# Patient Record
Sex: Female | Born: 1960
Health system: Southern US, Community
[De-identification: ages and names within clinical notes are randomized; demographics above are authoritative.]

## PROBLEM LIST (undated history)

## (undated) DIAGNOSIS — Z98811 Dental restoration status: Secondary | ICD-10-CM

## (undated) DIAGNOSIS — R011 Cardiac murmur, unspecified: Secondary | ICD-10-CM

## (undated) DIAGNOSIS — E039 Hypothyroidism, unspecified: Secondary | ICD-10-CM

## (undated) DIAGNOSIS — Z8619 Personal history of other infectious and parasitic diseases: Secondary | ICD-10-CM

## (undated) DIAGNOSIS — M1990 Unspecified osteoarthritis, unspecified site: Secondary | ICD-10-CM

## (undated) DIAGNOSIS — M5020 Other cervical disc displacement, unspecified cervical region: Secondary | ICD-10-CM

## (undated) DIAGNOSIS — T783XXA Angioneurotic edema, initial encounter: Secondary | ICD-10-CM

## (undated) DIAGNOSIS — M2241 Chondromalacia patellae, right knee: Secondary | ICD-10-CM

## (undated) DIAGNOSIS — E785 Hyperlipidemia, unspecified: Secondary | ICD-10-CM

## (undated) DIAGNOSIS — R000 Tachycardia, unspecified: Secondary | ICD-10-CM

## (undated) DIAGNOSIS — K219 Gastro-esophageal reflux disease without esophagitis: Secondary | ICD-10-CM

## (undated) DIAGNOSIS — T7840XA Allergy, unspecified, initial encounter: Secondary | ICD-10-CM

## (undated) DIAGNOSIS — M6751 Plica syndrome, right knee: Secondary | ICD-10-CM

## (undated) DIAGNOSIS — Z8489 Family history of other specified conditions: Secondary | ICD-10-CM

## (undated) DIAGNOSIS — B029 Zoster without complications: Secondary | ICD-10-CM

## (undated) HISTORY — DX: Gastro-esophageal reflux disease without esophagitis: K21.9

## (undated) HISTORY — PX: SHOULDER ARTHROSCOPY: SHX128

## (undated) HISTORY — DX: Hypothyroidism, unspecified: E03.9

## (undated) HISTORY — DX: Hyperlipidemia, unspecified: E78.5

## (undated) HISTORY — DX: Allergy, unspecified, initial encounter: T78.40XA

## (undated) HISTORY — DX: Unspecified osteoarthritis, unspecified site: M19.90

## (undated) HISTORY — PX: SPINE SURGERY: SHX786

## (undated) HISTORY — DX: Angioneurotic edema, initial encounter: T78.3XXA

## (undated) HISTORY — DX: Other cervical disc displacement, unspecified cervical region: M50.20

---
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
## 2001-05-07 ENCOUNTER — Other Ambulatory Visit: Admission: RE | Admit: 2001-05-07 | Discharge: 2001-05-07 | Payer: Self-pay | Admitting: *Deleted

## 2003-04-28 ENCOUNTER — Encounter: Payer: Self-pay | Admitting: *Deleted

## 2003-04-28 ENCOUNTER — Ambulatory Visit (HOSPITAL_COMMUNITY): Admission: RE | Admit: 2003-04-28 | Discharge: 2003-04-28 | Payer: Self-pay | Admitting: *Deleted

## 2007-04-15 ENCOUNTER — Ambulatory Visit (HOSPITAL_COMMUNITY): Admission: RE | Admit: 2007-04-15 | Discharge: 2007-04-15 | Payer: Self-pay | Admitting: Pulmonary Disease

## 2007-04-18 ENCOUNTER — Encounter (INDEPENDENT_AMBULATORY_CARE_PROVIDER_SITE_OTHER): Payer: Self-pay | Admitting: General Surgery

## 2007-04-18 ENCOUNTER — Ambulatory Visit (HOSPITAL_COMMUNITY): Admission: RE | Admit: 2007-04-18 | Discharge: 2007-04-18 | Payer: Self-pay | Admitting: General Surgery

## 2007-04-18 HISTORY — PX: CHOLECYSTECTOMY: SHX55

## 2007-11-12 ENCOUNTER — Other Ambulatory Visit: Admission: RE | Admit: 2007-11-12 | Discharge: 2007-11-12 | Payer: Self-pay | Admitting: Obstetrics & Gynecology

## 2007-11-14 ENCOUNTER — Ambulatory Visit (HOSPITAL_COMMUNITY): Admission: RE | Admit: 2007-11-14 | Discharge: 2007-11-14 | Payer: Self-pay | Admitting: Obstetrics & Gynecology

## 2008-03-03 ENCOUNTER — Ambulatory Visit (HOSPITAL_COMMUNITY): Admission: RE | Admit: 2008-03-03 | Discharge: 2008-03-03 | Payer: Self-pay | Admitting: Pulmonary Disease

## 2008-06-20 ENCOUNTER — Encounter: Payer: Self-pay | Admitting: Orthopedic Surgery

## 2008-06-23 ENCOUNTER — Ambulatory Visit: Payer: Self-pay | Admitting: Orthopedic Surgery

## 2008-06-23 DIAGNOSIS — M7580 Other shoulder lesions, unspecified shoulder: Secondary | ICD-10-CM

## 2008-06-23 DIAGNOSIS — M25519 Pain in unspecified shoulder: Secondary | ICD-10-CM

## 2008-11-22 ENCOUNTER — Ambulatory Visit: Payer: Self-pay | Admitting: Orthopedic Surgery

## 2008-11-29 ENCOUNTER — Encounter (HOSPITAL_COMMUNITY): Admission: RE | Admit: 2008-11-29 | Discharge: 2008-12-29 | Payer: Self-pay | Admitting: Orthopedic Surgery

## 2008-12-06 ENCOUNTER — Encounter: Payer: Self-pay | Admitting: Orthopedic Surgery

## 2008-12-14 ENCOUNTER — Encounter: Payer: Self-pay | Admitting: Orthopedic Surgery

## 2008-12-27 ENCOUNTER — Encounter: Payer: Self-pay | Admitting: Orthopedic Surgery

## 2009-01-04 ENCOUNTER — Encounter (HOSPITAL_COMMUNITY): Admission: RE | Admit: 2009-01-04 | Discharge: 2009-02-03 | Payer: Self-pay | Admitting: Orthopedic Surgery

## 2009-01-20 ENCOUNTER — Ambulatory Visit: Payer: Self-pay | Admitting: Orthopedic Surgery

## 2009-02-01 ENCOUNTER — Telehealth: Payer: Self-pay | Admitting: Orthopedic Surgery

## 2009-02-07 ENCOUNTER — Emergency Department (HOSPITAL_COMMUNITY): Admission: EM | Admit: 2009-02-07 | Discharge: 2009-02-07 | Payer: Self-pay | Admitting: Emergency Medicine

## 2009-02-23 ENCOUNTER — Encounter: Payer: Self-pay | Admitting: Orthopedic Surgery

## 2009-02-27 ENCOUNTER — Emergency Department (HOSPITAL_COMMUNITY): Admission: EM | Admit: 2009-02-27 | Discharge: 2009-02-27 | Payer: Self-pay | Admitting: Emergency Medicine

## 2009-03-09 ENCOUNTER — Ambulatory Visit: Payer: Self-pay | Admitting: Orthopedic Surgery

## 2009-03-09 DIAGNOSIS — M75120 Complete rotator cuff tear or rupture of unspecified shoulder, not specified as traumatic: Secondary | ICD-10-CM

## 2009-03-10 ENCOUNTER — Encounter (INDEPENDENT_AMBULATORY_CARE_PROVIDER_SITE_OTHER): Payer: Self-pay | Admitting: *Deleted

## 2009-03-15 ENCOUNTER — Encounter: Payer: Self-pay | Admitting: Orthopedic Surgery

## 2009-03-23 ENCOUNTER — Ambulatory Visit: Payer: Self-pay | Admitting: Orthopedic Surgery

## 2009-04-25 ENCOUNTER — Ambulatory Visit: Payer: Self-pay | Admitting: Orthopedic Surgery

## 2009-04-25 ENCOUNTER — Telehealth: Payer: Self-pay | Admitting: Orthopedic Surgery

## 2009-04-26 ENCOUNTER — Encounter: Payer: Self-pay | Admitting: Orthopedic Surgery

## 2009-05-25 ENCOUNTER — Telehealth: Payer: Self-pay | Admitting: Orthopedic Surgery

## 2009-05-31 ENCOUNTER — Encounter (HOSPITAL_COMMUNITY): Admission: RE | Admit: 2009-05-31 | Discharge: 2009-06-29 | Payer: Self-pay | Admitting: Surgery

## 2009-07-01 ENCOUNTER — Encounter (HOSPITAL_COMMUNITY): Admission: RE | Admit: 2009-07-01 | Discharge: 2009-07-31 | Payer: Self-pay | Admitting: Surgery

## 2009-08-01 ENCOUNTER — Encounter (HOSPITAL_COMMUNITY): Admission: RE | Admit: 2009-08-01 | Discharge: 2009-08-31 | Payer: Self-pay | Admitting: Surgery

## 2010-06-09 ENCOUNTER — Ambulatory Visit (HOSPITAL_COMMUNITY): Admission: RE | Admit: 2010-06-09 | Discharge: 2010-06-09 | Payer: Self-pay | Admitting: Pulmonary Disease

## 2010-09-04 ENCOUNTER — Ambulatory Visit: Payer: Self-pay | Admitting: Internal Medicine

## 2011-01-09 LAB — POCT I-STAT, CHEM 8
Creatinine, Ser: 0.9 mg/dL (ref 0.4–1.2)
Glucose, Bld: 115 mg/dL — ABNORMAL HIGH (ref 70–99)
Hemoglobin: 14.6 g/dL (ref 12.0–15.0)
Potassium: 3.6 mEq/L (ref 3.5–5.1)

## 2011-01-15 ENCOUNTER — Emergency Department (HOSPITAL_COMMUNITY)
Admission: EM | Admit: 2011-01-15 | Discharge: 2011-01-16 | Disposition: A | Discharge status: Home / Self Care | Payer: BLUE CROSS/BLUE SHIELD | Attending: Emergency Medicine | Admitting: Emergency Medicine

## 2011-01-15 DIAGNOSIS — E039 Hypothyroidism, unspecified: Secondary | ICD-10-CM | POA: Insufficient documentation

## 2011-01-15 DIAGNOSIS — Z79899 Other long term (current) drug therapy: Secondary | ICD-10-CM | POA: Insufficient documentation

## 2011-01-15 DIAGNOSIS — N898 Other specified noninflammatory disorders of vagina: Secondary | ICD-10-CM | POA: Insufficient documentation

## 2011-01-15 DIAGNOSIS — R109 Unspecified abdominal pain: Secondary | ICD-10-CM | POA: Insufficient documentation

## 2011-01-15 DIAGNOSIS — K219 Gastro-esophageal reflux disease without esophagitis: Secondary | ICD-10-CM | POA: Insufficient documentation

## 2011-01-15 LAB — DIFFERENTIAL
Basophils Absolute: 0 10*3/uL (ref 0.0–0.1)
Eosinophils Relative: 3 % (ref 0–5)
Lymphocytes Relative: 29 % (ref 12–46)
Lymphs Abs: 1.9 10*3/uL (ref 0.7–4.0)
Monocytes Absolute: 0.6 10*3/uL (ref 0.1–1.0)
Monocytes Relative: 10 % (ref 3–12)

## 2011-01-15 LAB — URINALYSIS, ROUTINE W REFLEX MICROSCOPIC
Bilirubin Urine: NEGATIVE
Hgb urine dipstick: NEGATIVE
Protein, ur: NEGATIVE mg/dL
Urobilinogen, UA: 0.2 mg/dL (ref 0.0–1.0)

## 2011-01-15 LAB — CBC
HCT: 36 % (ref 36.0–46.0)
MCH: 30.8 pg (ref 26.0–34.0)
MCHC: 34.2 g/dL (ref 30.0–36.0)
MCV: 90.2 fL (ref 78.0–100.0)
RDW: 13 % (ref 11.5–15.5)

## 2011-01-16 ENCOUNTER — Ambulatory Visit (HOSPITAL_COMMUNITY): Admit: 2011-01-16 | Payer: BLUE CROSS/BLUE SHIELD

## 2011-01-16 ENCOUNTER — Ambulatory Visit (HOSPITAL_COMMUNITY)
Admission: RE | Admit: 2011-01-16 | Discharge: 2011-01-16 | Disposition: A | Discharge status: Home / Self Care | Payer: BLUE CROSS/BLUE SHIELD | Source: Ambulatory Visit | Attending: Emergency Medicine | Admitting: Emergency Medicine

## 2011-01-16 ENCOUNTER — Other Ambulatory Visit (HOSPITAL_COMMUNITY): Payer: Self-pay | Admitting: Emergency Medicine

## 2011-01-16 DIAGNOSIS — R9389 Abnormal findings on diagnostic imaging of other specified body structures: Secondary | ICD-10-CM | POA: Insufficient documentation

## 2011-01-16 DIAGNOSIS — N949 Unspecified condition associated with female genital organs and menstrual cycle: Secondary | ICD-10-CM | POA: Insufficient documentation

## 2011-01-16 DIAGNOSIS — N83209 Unspecified ovarian cyst, unspecified side: Secondary | ICD-10-CM | POA: Insufficient documentation

## 2011-01-16 DIAGNOSIS — N939 Abnormal uterine and vaginal bleeding, unspecified: Secondary | ICD-10-CM

## 2011-01-16 DIAGNOSIS — N920 Excessive and frequent menstruation with regular cycle: Secondary | ICD-10-CM | POA: Insufficient documentation

## 2011-01-22 ENCOUNTER — Other Ambulatory Visit (HOSPITAL_COMMUNITY)
Admission: RE | Admit: 2011-01-22 | Discharge: 2011-01-22 | Disposition: A | Discharge status: Home / Self Care | Payer: BLUE CROSS/BLUE SHIELD | Source: Ambulatory Visit | Attending: Obstetrics & Gynecology | Admitting: Obstetrics & Gynecology

## 2011-01-22 ENCOUNTER — Other Ambulatory Visit: Payer: Self-pay | Admitting: Obstetrics & Gynecology

## 2011-01-22 DIAGNOSIS — Z01419 Encounter for gynecological examination (general) (routine) without abnormal findings: Secondary | ICD-10-CM | POA: Insufficient documentation

## 2011-02-13 NOTE — H&P (Signed)
NAMELATRISA, Ashley [name]                ACCOUNT NO.:  [id number]   MEDICAL RECORD NO.:  [id number]          PATIENT TYPE:  AMB   LOCATION:  DAY                           FACILITY:  [facility]   PHYSICIAN:  Dalia Heading, M.D.  DATE OF BIRTH:  [birth year]   DATE OF ADMISSION:  DATE OF DISCHARGE:  LH                              HISTORY & PHYSICAL   CHIEF COMPLAINT:  Cholecystitis, cholelithiasis.   HISTORY OF PRESENT ILLNESS:  The patient is a 50 year old white female  who is referred for evaluation and treatment of biliary colic secondary  to cholelithiasis.  She has been having right upper quadrant abdominal  discomfort, nausea and bloating for many months.  She does have fatty  food intolerance.  No fever, chills or jaundice have been noted.   PAST MEDICAL HISTORY:  1. Hypothyroidism.  2. Possible SVT.   PAST SURGICAL HISTORY:  Appendectomy.   CURRENT MEDICATIONS:  1. Atenolol 50 mg p.o. daily.  2. Synthroid 100 mcg p.o. daily.  3. Claritin p.r.n.   ALLERGIES:  SULFA.   REVIEW OF SYSTEMS:  The patient denies drinking or smoking.  She denies  any recent episodes of SVT.   PHYSICAL EXAMINATION:  GENERAL:  The patient is a well-developed, well-  nourished white female in no acute distress.  HEENT:  No scleral icterus.  LUNGS:  Clear to auscultation with good breath sounds bilaterally.  HEART:  Regular rate and rhythm without S3, S4 or murmurs.  ABDOMEN:  Soft, nontender, nondistended.  No hepatosplenomegaly, masses  or hernias are identified.   STUDIES:  Ultrasound of the gallbladder reveals cholelithiasis with  normal common bile duct.   IMPRESSION:  Cholecystitis, cholelithiasis.   PLAN:  The patient is scheduled for a laparoscopic cholecystectomy on  April 18, 2007.  The risks and benefits of the procedure including  bleeding, infection, hepatobiliary injury and the possibility of an open  procedure were fully explained to the patient, gave informed consent.      Dalia Heading, M.D.  Electronically Signed     MAJ/MEDQ  D:  04/17/2007  T:  04/17/2007  Job:  [id number]   cc:   Short Stay at [facility] L. Juanetta Gosling, M.D.  Fax: [phone]

## 2011-02-13 NOTE — Op Note (Signed)
NAMEJENYA, Ashley [name]                ACCOUNT NO.:  [id number]   MEDICAL RECORD NO.:  [id number]          PATIENT TYPE:  AMB   LOCATION:  DAY                           FACILITY:  [facility]   PHYSICIAN:  Dalia Heading, M.D.  DATE OF BIRTH:  14-Nov-1960   DATE OF PROCEDURE:  04/18/2007  DATE OF DISCHARGE:                               OPERATIVE REPORT   PREOPERATIVE DIAGNOSIS:  Cholecystitis and cholelithiasis.   POSTOPERATIVE DIAGNOSIS:  Cholecystitis and cholelithiasis.   PROCEDURE:  Laparoscopic cholecystectomy.   SURGEON:  Dalia Heading, M.D.   ANESTHESIA:  General endotracheal.   INDICATIONS:  The patient is a 50 year old white female who presents  with cholecystitis secondary to cholelithiasis.  The risks and benefits  of the procedure including bleeding, infection, hepatobiliary injury,  and the possibility of an open procedure were fully explained to the  patient, who gave informed consent.   PROCEDURE NOTE:  The patient was placed in the supine position.  After  induction of general endotracheal anesthesia, the abdomen was prepped  and draped using the usual sterile technique with Betadine.  Surgical  site confirmation was performed.   An infraumbilical incision was made down to the fascia.  The Veress  needle was introduced into the abdominal cavity and confirmation of  placement was done using the saline drop test.  The abdomen was then  insufflated to 16 mmHg pressure.  An 11 mm trocar was introduced into  the abdominal cavity under direct visualization without difficulty.  The  patient was placed in reversed Trendelenburg position and an additional  11 mm trocar was placed in the epigastric region and 5 mm trocars were  placed in the right upper quadrant and right flank regions.  The liver  was inspected and noted to be within normal limits.  The gallbladder was  retracted superiorly and laterally.  The dissection was begun around the  infundibulum of the gallbladder.   The cystic duct was first identified.  Its juncture to the infundibulum was fully identified.  An endoclip was  placed proximally and distally on the cystic duct and the cystic duct  was divided.  This, likewise, was done on the cystic artery.  The  gallbladder was then freed away from the gallbladder fossa using Bovie  electrocautery.  The gallbladder was delivered through the epigastric  trocar site using an EndoCatch bag.  The gallbladder fossa was  inspected.  No abnormal bleeding or bile leakage was noted.  Surgicel  was placed in the gallbladder fossa.  All fluid and air were then  evacuated from the abdominal cavity prior to removal of the trocars.   All wounds were irrigated with normal saline.  All wounds were injected  with 0.5% Sensorcaine.  The infraumbilical fascia as well as epigastric  fascia were reapproximated using 0 Vicryl interrupted sutures.  All skin  incisions were closed using a 4-0 Vicryl subcuticular suture.  0.5%  Sensorcaine was instilled in the surrounding  wound.  Dermabond was then applied.  All tape and needle counts were  correct at the end of the procedure.  The patient was extubated in the  operating room and went back to the recovery room awake in stable  condition.  Complications none. Specimens gallbladder.  Blood loss  minimal.      Dalia Heading, M.D.  Electronically Signed     MAJ/MEDQ  D:  04/18/2007  T:  04/18/2007  Job:  [id number]   cc:   Ramon Dredge L. Juanetta Gosling, M.D.  Fax: [phone]

## 2011-03-09 ENCOUNTER — Encounter (HOSPITAL_COMMUNITY): Payer: BC Managed Care – PPO

## 2011-03-09 ENCOUNTER — Other Ambulatory Visit: Payer: Self-pay | Admitting: Obstetrics & Gynecology

## 2011-03-09 ENCOUNTER — Other Ambulatory Visit: Payer: Self-pay | Admitting: Infectious Diseases

## 2011-03-09 LAB — URINALYSIS, ROUTINE W REFLEX MICROSCOPIC
Glucose, UA: NEGATIVE mg/dL
Ketones, ur: NEGATIVE mg/dL
Leukocytes, UA: NEGATIVE
Nitrite: NEGATIVE
Protein, ur: NEGATIVE mg/dL

## 2011-03-09 LAB — COMPREHENSIVE METABOLIC PANEL
Alkaline Phosphatase: 64 U/L (ref 39–117)
BUN: 15 mg/dL (ref 6–23)
Chloride: 102 mEq/L (ref 96–112)
GFR calc Af Amer: >60 mL/min (ref >60)
Glucose, Bld: 119 mg/dL — ABNORMAL HIGH (ref 70–99)
Potassium: 4.3 mEq/L (ref 3.5–5.1)
Total Bilirubin: 0.3 mg/dL (ref 0.3–1.2)

## 2011-03-09 LAB — CBC
HCT: 36.4 % (ref 36.0–46.0)
Hemoglobin: 11.8 g/dL — ABNORMAL LOW (ref 12.0–15.0)
WBC: 4.4 10*3/uL (ref 4.0–10.5)

## 2011-03-14 ENCOUNTER — Other Ambulatory Visit: Payer: Self-pay | Admitting: Obstetrics & Gynecology

## 2011-03-14 ENCOUNTER — Ambulatory Visit (HOSPITAL_COMMUNITY)
Admission: RE | Admit: 2011-03-14 | Discharge: 2011-03-14 | Disposition: A | Discharge status: Home / Self Care | Payer: BC Managed Care – PPO | Source: Ambulatory Visit | Attending: Obstetrics & Gynecology | Admitting: Obstetrics & Gynecology

## 2011-03-14 DIAGNOSIS — N946 Dysmenorrhea, unspecified: Secondary | ICD-10-CM | POA: Insufficient documentation

## 2011-03-14 DIAGNOSIS — Z01812 Encounter for preprocedural laboratory examination: Secondary | ICD-10-CM | POA: Insufficient documentation

## 2011-03-14 DIAGNOSIS — N920 Excessive and frequent menstruation with regular cycle: Secondary | ICD-10-CM | POA: Insufficient documentation

## 2011-03-14 DIAGNOSIS — Z01810 Encounter for preprocedural cardiovascular examination: Secondary | ICD-10-CM | POA: Insufficient documentation

## 2011-03-14 HISTORY — PX: HYSTEROSCOPY W/D&C: SHX1775

## 2011-03-14 HISTORY — PX: ENDOMETRIAL ABLATION: SHX621

## 2011-03-15 NOTE — Op Note (Signed)
  NAMEPUANANI, GENE                ACCOUNT NO.:  [id number]  MEDICAL RECORD NO.:  [id number]  LOCATION:  DAYP                          FACILITY:  [facility]  PHYSICIAN:  Lazaro Arms, M.D.   DATE OF BIRTH:  07/25/1961  DATE OF PROCEDURE:  03/14/2011 DATE OF DISCHARGE:                              OPERATIVE REPORT   PREOPERATIVE DIAGNOSES: 1. Menometrorrhagia. 2. Dysmenorrhea.  POSTOPERATIVE DIAGNOSES: 1. Menometrorrhagia. 2. Dysmenorrhea.  PROCEDURE:  Hysteroscopy, D and C, endometrial ablation.  SURGEON:  Lazaro Arms, MD  ANESTHESIA:  General endotracheal.  FINDINGS:  The patient normal endometrium and polyps.  No fibroids.  No abnormalities.  DESCRIPTION OF OPERATION:  The patient was taken to the operating room, placed in the supine position where she underwent general endotracheal anesthesia.  She was then placed in lithotomy position, prepped and draped in usual sterile fashion.  Graves speculum was placed.  Cervix was grasped and the cervix was dilated serially to allow passage of the hysteroscope.  A diagnostic hysteroscopy was performed found to be normal.  ThermaChoice III endometrial ablation balloon was used, 36 mL of D5W was required to maintain a pressure between 190 and 200 mmHg throughout the procedure.  The fluid was heated up to 87 degrees Celsius.  Total therapy time was 11 minutes and 22 seconds.  The patient tolerated the procedure well.  She experienced minimal blood loss, taken to recovery room in stable condition.  All counts were correct.  All the equipment worked properly.  All the fluid was returned at the end of the procedure.  She received Ancef and Toradol prophylactically.     Lazaro Arms, M.D.    Loraine Maple  D:  03/14/2011  T:  03/15/2011  Job:  [id number]  Electronically Signed by Duane Lope M.D. on 03/15/2011 05:05:38 PM

## 2011-07-16 LAB — HEPATIC FUNCTION PANEL
ALT: 28
Alkaline Phosphatase: 43
Bilirubin, Direct: 0.1
Indirect Bilirubin: 0.6
Total Bilirubin: 0.7

## 2011-07-16 LAB — BASIC METABOLIC PANEL
CO2: 26
Chloride: 104
Creatinine, Ser: 0.83
GFR calc Af Amer: >60
Potassium: 4.7

## 2011-07-16 LAB — CBC
HCT: 40.2
MCHC: 33.9
MCV: 93.9
RBC: 4.28

## 2011-07-16 LAB — HCG, QUANTITATIVE, PREGNANCY: hCG, Beta Chain, Quant, S: <2

## 2011-07-24 ENCOUNTER — Other Ambulatory Visit (HOSPITAL_COMMUNITY): Payer: Self-pay | Admitting: Pulmonary Disease

## 2011-07-24 DIAGNOSIS — R52 Pain, unspecified: Secondary | ICD-10-CM

## 2011-07-25 ENCOUNTER — Other Ambulatory Visit (HOSPITAL_COMMUNITY): Payer: Self-pay | Admitting: Pulmonary Disease

## 2011-07-25 ENCOUNTER — Ambulatory Visit (HOSPITAL_COMMUNITY)
Admission: RE | Admit: 2011-07-25 | Discharge: 2011-07-25 | Disposition: A | Discharge status: Home / Self Care | Payer: BC Managed Care – PPO | Source: Ambulatory Visit | Attending: Pulmonary Disease | Admitting: Pulmonary Disease

## 2011-07-25 DIAGNOSIS — R52 Pain, unspecified: Secondary | ICD-10-CM

## 2011-07-25 DIAGNOSIS — N83209 Unspecified ovarian cyst, unspecified side: Secondary | ICD-10-CM | POA: Insufficient documentation

## 2011-07-25 DIAGNOSIS — R109 Unspecified abdominal pain: Secondary | ICD-10-CM | POA: Insufficient documentation

## 2011-07-25 DIAGNOSIS — N949 Unspecified condition associated with female genital organs and menstrual cycle: Secondary | ICD-10-CM | POA: Insufficient documentation

## 2011-08-08 ENCOUNTER — Other Ambulatory Visit (HOSPITAL_COMMUNITY): Payer: Self-pay | Admitting: Pulmonary Disease

## 2011-08-08 ENCOUNTER — Ambulatory Visit (HOSPITAL_COMMUNITY)
Admission: RE | Admit: 2011-08-08 | Discharge: 2011-08-08 | Disposition: A | Discharge status: Home / Self Care | Payer: BC Managed Care – PPO | Source: Ambulatory Visit | Attending: Pulmonary Disease | Admitting: Pulmonary Disease

## 2011-08-08 DIAGNOSIS — M79675 Pain in left toe(s): Secondary | ICD-10-CM

## 2011-08-08 DIAGNOSIS — S8990XA Unspecified injury of unspecified lower leg, initial encounter: Secondary | ICD-10-CM | POA: Insufficient documentation

## 2011-08-08 DIAGNOSIS — M79609 Pain in unspecified limb: Secondary | ICD-10-CM | POA: Insufficient documentation

## 2011-08-08 DIAGNOSIS — S99919A Unspecified injury of unspecified ankle, initial encounter: Secondary | ICD-10-CM | POA: Insufficient documentation

## 2011-08-08 DIAGNOSIS — X58XXXA Exposure to other specified factors, initial encounter: Secondary | ICD-10-CM | POA: Insufficient documentation

## 2011-08-23 ENCOUNTER — Emergency Department (HOSPITAL_COMMUNITY)
Admission: EM | Admit: 2011-08-23 | Discharge: 2011-08-23 | Disposition: A | Discharge status: Home / Self Care | Payer: BC Managed Care – PPO | Attending: Emergency Medicine | Admitting: Emergency Medicine

## 2011-08-23 ENCOUNTER — Encounter: Payer: Self-pay | Admitting: *Deleted

## 2011-08-23 DIAGNOSIS — J209 Acute bronchitis, unspecified: Secondary | ICD-10-CM | POA: Insufficient documentation

## 2011-08-23 HISTORY — DX: Tachycardia, unspecified: R00.0

## 2011-08-23 MED ORDER — AZITHROMYCIN 250 MG PO TABS: 250.0000 mg | ORAL_TABLET | Freq: Every day | ORAL | Status: AC

## 2011-08-23 MED ORDER — HYDROCODONE-ACETAMINOPHEN 5-325 MG PO TABS: 2.0000 | ORAL_TABLET | ORAL | Status: AC | PRN

## 2011-08-23 NOTE — ED Provider Notes (Signed)
Scribed for Felisa Bonier, MD, the patient was seen in room [facility]12/[facility]12. This chart was scribed by [employer]. The patient's care started at 07:34  CSN: [id number] Arrival date & time: 08/23/2011  7:29 AM   First MD Initiated Contact with Patient 08/23/11 [phone]      Chief Complaint  Patient presents with  . Cough  . Fever  . Generalized Body Aches   HPI Ashley [name] is a 51 y.o. female who presents to the Emergency Department complaining of Cough for 1 week with associated 101 fever and generalized body aches. She states that cough is productive with a light yellowish sputum. She denies a history of asthma or tobacco use.  Past Medical History  Diagnosis Date  . Tachycardia   . Thyroid disease   . Acid reflux   . Seasonal allergies     Past Surgical History  Procedure Date  . Appendectomy   . Cholecystectomy   . Shoulder surgery right  . Uterine ablation     History reviewed. No pertinent family history.  History  Substance Use Topics  . Smoking status: Never Smoker   . Smokeless tobacco: Not on file  . Alcohol Use: No    OB History    Grav Para Term Preterm Abortions TAB SAB Ect Mult Living                  Review of Systems  Constitutional: Negative for fever.       10 Systems reviewed and are negative for acute change except as noted in the HPI.  HENT: Negative for rhinorrhea.   Eyes: Negative for discharge and redness.  Respiratory: Negative for cough and shortness of breath.   Cardiovascular: Negative for chest pain.  Gastrointestinal: Negative for vomiting, abdominal pain and diarrhea.  Genitourinary: Negative for dysuria.  Musculoskeletal: Negative for back pain.  Skin: Negative for rash.  Neurological: Negative for dizziness, syncope, speech difficulty, weakness, numbness and headaches.  Psychiatric/Behavioral: Negative for suicidal ideas, hallucinations and confusion.    Allergies  Sulfonamide derivatives  Home Medications  No  current outpatient prescriptions on file.  BP 119/73  Pulse 87  Temp(Src) 98.2 F (36.8 C) (Oral)  Resp 18  Ht 5' 11.5" (1.816 m)  Wt 190 lb (86.183 kg)  BMI 26.13 kg/m2  SpO2 100%  Physical Exam  Constitutional: She is oriented to person, place, and time. She appears well-developed and well-nourished.  Non-toxic appearance. She does not have a sickly appearance.  HENT:  Head: Normocephalic and atraumatic.  Right Ear: Tympanic membrane normal.  Left Ear: Tympanic membrane normal. Tympanic membrane is not erythematous.  Mouth/Throat: Oropharynx is clear and moist. No posterior oropharyngeal edema or posterior oropharyngeal erythema.       TM's not congested bilaterally.  Eyes: Conjunctivae, EOM and lids are normal. Pupils are equal, round, and reactive to light. No scleral icterus.  Neck: Trachea normal and normal range of motion. Neck supple.  Cardiovascular: Regular rhythm and normal heart sounds.   Pulmonary/Chest: Effort normal and breath sounds normal. No respiratory distress. She has no wheezes. She has no rhonchi. She has no rales.  Abdominal: Soft. Normal appearance and bowel sounds are normal. There is no tenderness. There is no rebound, no guarding and no CVA tenderness.  Musculoskeletal: Normal range of motion.  Neurological: She is alert and oriented to person, place, and time. She has normal strength.  Skin: Skin is warm, dry and intact. No rash noted.    ED Course  Procedures  DIAGNOSTIC STUDIES: Oxygen Saturation is 100% on room air, normal by my interpretation.    COORDINATION OF CARE: 07:45 - EDP examined patient at bedside. Patient to be discharged with antibiotics and Hydrocodone prescription for cough suppression. She is aware of plan and is in agreement.     MDM: Acute bronchitis, viral upper respiratory tract infection or both entertained in the differential diagnosis. The physical examination does not suggest pneumonia and no chest x-ray is indicated by  the physical examination.   Scribe Attestation I personally performed the services described in this documentation, which was scribed in my presence. The recorded information has been reviewed and considered.   Felisa Bonier, MD 08/23/11 [phone]

## 2011-08-23 NOTE — ED Notes (Signed)
EDP at bedside, see edp's assessment.

## 2011-08-23 NOTE — ED Notes (Signed)
Pt c/o worsening cough with fever and body aches x 1 week

## 2012-01-15 ENCOUNTER — Other Ambulatory Visit: Payer: Self-pay | Admitting: Sports Medicine

## 2012-01-15 DIAGNOSIS — M545 Low back pain: Secondary | ICD-10-CM

## 2012-01-18 ENCOUNTER — Other Ambulatory Visit: Payer: BC Managed Care – PPO

## 2012-01-23 ENCOUNTER — Ambulatory Visit
Admission: RE | Admit: 2012-01-23 | Discharge: 2012-01-23 | Disposition: A | Discharge status: Home / Self Care | Payer: BC Managed Care – PPO | Source: Ambulatory Visit | Attending: Sports Medicine | Admitting: Sports Medicine

## 2012-01-23 DIAGNOSIS — M545 Low back pain: Secondary | ICD-10-CM

## 2012-12-17 ENCOUNTER — Telehealth: Payer: Self-pay | Admitting: Adult Health

## 2012-12-17 NOTE — Telephone Encounter (Signed)
Pt needs to schedule an appt with Dr. Despina Hidden per Cyril Mourning. Pt to call tomorrow and schedule an appt.

## 2012-12-17 NOTE — Telephone Encounter (Signed)
Pt. still having vaginal burning. On new Estrogen dose...half oz every other day. Has had to take 3 Advil for the burning. What do you advise?

## 2012-12-25 ENCOUNTER — Ambulatory Visit: Payer: Self-pay | Admitting: Obstetrics & Gynecology

## 2013-12-17 ENCOUNTER — Ambulatory Visit: Payer: BC Managed Care – PPO | Admitting: Orthopedic Surgery

## 2014-02-03 ENCOUNTER — Other Ambulatory Visit: Payer: Self-pay | Admitting: Adult Health

## 2014-02-15 ENCOUNTER — Ambulatory Visit (INDEPENDENT_AMBULATORY_CARE_PROVIDER_SITE_OTHER): Payer: BC Managed Care – PPO | Admitting: Adult Health

## 2014-02-15 ENCOUNTER — Encounter: Payer: Self-pay | Admitting: Adult Health

## 2014-02-15 ENCOUNTER — Other Ambulatory Visit (HOSPITAL_COMMUNITY)
Admission: RE | Admit: 2014-02-15 | Discharge: 2014-02-15 | Disposition: A | Discharge status: Home / Self Care | Payer: BC Managed Care – PPO | Source: Ambulatory Visit | Attending: Adult Health | Admitting: Adult Health

## 2014-02-15 VITALS — BP 120/78 | HR 74 | Ht 71.5 in | Wt 198.0 lb

## 2014-02-15 DIAGNOSIS — Z1151 Encounter for screening for human papillomavirus (HPV): Secondary | ICD-10-CM | POA: Insufficient documentation

## 2014-02-15 DIAGNOSIS — Z01419 Encounter for gynecological examination (general) (routine) without abnormal findings: Secondary | ICD-10-CM

## 2014-02-15 DIAGNOSIS — N950 Postmenopausal bleeding: Secondary | ICD-10-CM | POA: Insufficient documentation

## 2014-02-15 DIAGNOSIS — Z1212 Encounter for screening for malignant neoplasm of rectum: Secondary | ICD-10-CM

## 2014-02-15 LAB — HEMOCCULT GUIAC POC 1CARD (OFFICE): FECAL OCCULT BLD: NEGATIVE

## 2014-02-15 NOTE — Progress Notes (Signed)
Patient ID: Ashley [name], female   DOB: 09/07/1961, 53 y.o.   MRN: [id number] History of Present Illness: Ashley [name] is a 53 year old white female, married in for a pap and physical.She had some spotting,brown no pain and she could not remember the last time she saw bleeding.She is adopting 2 children.   Current Medications, Allergies, Past Medical History, Past Surgical History, Family History and Social History were reviewed in [facility] electronic medical record.     Review of Systems: Patient denies any headaches, blurred vision, shortness of breath, chest pain, abdominal pain, problems with bowel movements, urination, or intercourse. No joint pain or mood swings.See HPI.    Physical Exam:BP 120/78  Pulse 74  Ht 5' 11.5" (1.816 m)  Wt 198 lb (89.812 kg)  BMI 27.23 kg/m2 General:  Well developed, well nourished, no acute distress Skin:  Warm and dry Neck:  Midline trachea, normal thyroid Lungs; Clear to auscultation bilaterally Breast:  No dominant palpable mass, retraction, or nipple discharge Cardiovascular: Regular rate and rhythm Abdomen:  Soft, non tender, no hepatosplenomegaly Pelvic:  External genitalia is normal in appearance.  The vagina is normal in appearance.  The cervix is bulbous.Pap with HPV performed.  Uterus is felt to be normal size, shape, and contour.  No                adnexal masses or tenderness noted. Rectal: Good sphincter tone, no polyps, or hemorrhoids felt.  Hemoccult negative. Extremities:  No swelling or varicosities noted Psych:  No mood changes, alert and cooperative, seems happy   Impression: Yearly gyn exam PMB    Plan: Physical in 1 year Mammogram advised now and yearly Colonoscopy advised Return in 1 0 days for US and see me See Dr Juanetta GoslingHawkins for labs Review handout on PMB

## 2014-02-15 NOTE — Patient Instructions (Signed)
Physical in 1 year Mammogram yearly Return 10 days for US Colonoscopy advised Postmenopausal Bleeding Postmenopausal bleeding is any bleeding a woman has after she has entered into menopause. Menopause is the end of a woman's fertile years. After menopause, a woman no longer ovulates or has menstrual periods.  Postmenopausal bleeding can be caused by various things. Any type of postmenopausal bleeding, even if it appears to be a typical menstrual period, is concerning. This should be evaluated by your health care provider. Any treatment will depend on the cause of the bleeding. HOME CARE INSTRUCTIONS Monitor your condition for any changes. The following actions may help to alleviate any discomfort you are experiencing:  Avoid the use of tampons and douches as directed by your health care provider.  Change your pads frequently.  Get regular pelvic exams and Pap tests.  Keep all follow-up appointments for diagnostic tests as directed by your health care provider. SEEK MEDICAL CARE IF:   Your bleeding lasts more than 1 week.  You have abdominal pain.  You have bleeding with sexual intercourse. SEEK IMMEDIATE MEDICAL CARE IF:   You have a fever, chills, headache, dizziness, muscle aches, and bleeding.  You have severe pain with bleeding.  You are passing blood clots.  You have bleeding and need more than 1 pad an hour.  You feel faint. MAKE SURE YOU:  Understand these instructions.  Will watch your condition.  Will get help right away if you are not doing well or get worse. Document Released: 12/26/2005 Document Revised: 07/08/2013 Document Reviewed: 04/16/2013 [facility] Patient Information 2014 [employer] [place].

## 2014-02-24 ENCOUNTER — Other Ambulatory Visit: Payer: Self-pay | Admitting: Adult Health

## 2014-02-24 DIAGNOSIS — N950 Postmenopausal bleeding: Secondary | ICD-10-CM

## 2014-02-25 ENCOUNTER — Ambulatory Visit (INDEPENDENT_AMBULATORY_CARE_PROVIDER_SITE_OTHER): Payer: BC Managed Care – PPO | Admitting: Adult Health

## 2014-02-25 ENCOUNTER — Ambulatory Visit (INDEPENDENT_AMBULATORY_CARE_PROVIDER_SITE_OTHER): Payer: BC Managed Care – PPO

## 2014-02-25 ENCOUNTER — Encounter: Payer: Self-pay | Admitting: Adult Health

## 2014-02-25 VITALS — BP 100/68 | Ht 71.5 in

## 2014-02-25 DIAGNOSIS — N950 Postmenopausal bleeding: Secondary | ICD-10-CM

## 2014-02-25 NOTE — Progress Notes (Signed)
Subjective:     Patient ID: Ashley [name], female   DOB: 12-17-1960, 53 y.o.   MRN: [id number]  HPI Ashley [name] is a53 year old white female in for [place] for PMB.   Review of Systems See HPI Reviewed past medical,surgical, social and family history. Reviewed medications and allergies.     Objective:   Physical Exam BP 100/68  Ht 5' 11.5" (1.816 m)reviewed [place] with pt.Uterus 7.1 x 5.2 x 3.5 cm, anteverted  Endometrium S/P Ablation 2.4 mm, symmetrical,  Right ovary 1.7 x 1.0 x 0.9 cm,  Left ovary 1.7 x 1.3 x 1.1 cm,  No free fluid or adnexal masses noted within the pelvis  Technician Comments:  Anteverted uterus noted Endom-2.28mm bilateral ovaries/adnexa appear WNL  Discussed that everything looks normal call if any more spotting.     Assessment:     PMB    Plan:     Follow up prn

## 2014-02-25 NOTE — Patient Instructions (Signed)
Follow up prn

## 2014-08-02 ENCOUNTER — Encounter: Payer: Self-pay | Admitting: Adult Health

## 2014-11-04 ENCOUNTER — Encounter: Payer: Self-pay | Admitting: Adult Health

## 2014-11-04 ENCOUNTER — Ambulatory Visit (INDEPENDENT_AMBULATORY_CARE_PROVIDER_SITE_OTHER): Payer: BLUE CROSS/BLUE SHIELD | Admitting: Adult Health

## 2014-11-04 VITALS — BP 122/70 | Ht 71.5 in | Wt 196.0 lb

## 2014-11-04 DIAGNOSIS — N951 Menopausal and female climacteric states: Secondary | ICD-10-CM

## 2014-11-04 DIAGNOSIS — R319 Hematuria, unspecified: Secondary | ICD-10-CM

## 2014-11-04 DIAGNOSIS — R232 Flushing: Secondary | ICD-10-CM

## 2014-11-04 DIAGNOSIS — Z1389 Encounter for screening for other disorder: Secondary | ICD-10-CM

## 2014-11-04 DIAGNOSIS — Z79890 Hormone replacement therapy (postmenopausal): Secondary | ICD-10-CM

## 2014-11-04 DIAGNOSIS — N898 Other specified noninflammatory disorders of vagina: Secondary | ICD-10-CM | POA: Insufficient documentation

## 2014-11-04 LAB — POCT URINALYSIS DIPSTICK

## 2014-11-04 MED ORDER — CONJ ESTROGENS-BAZEDOXIFENE 0.45-20 MG PO TABS: ORAL_TABLET | ORAL | Status: DC

## 2014-11-04 NOTE — Patient Instructions (Signed)
Menopause Menopause is the normal time of life when menstrual periods stop completely. Menopause is complete when you have missed 12 consecutive menstrual periods. It usually occurs between the ages of 48 years and 55 years. Very rarely does a woman develop menopause before the age of 40 years. At menopause, your ovaries stop producing the female hormones estrogen and progesterone. This can cause undesirable symptoms and also affect your health. Sometimes the symptoms may occur 4-5 years before the menopause begins. There is no relationship between menopause and:  Oral contraceptives.  Number of children you had.  Race.  The age your menstrual periods started (menarche). Heavy smokers and very thin women may develop menopause earlier in life. CAUSES  The ovaries stop producing the female hormones estrogen and progesterone.  Other causes include:  Surgery to remove both ovaries.  The ovaries stop functioning for no known reason.  Tumors of the pituitary gland in the brain.  Medical disease that affects the ovaries and hormone production.  Radiation treatment to the abdomen or pelvis.  Chemotherapy that affects the ovaries. SYMPTOMS   Hot flashes.  Night sweats.  Decrease in sex drive.  Vaginal dryness and thinning of the vagina causing painful intercourse.  Dryness of the skin and developing wrinkles.  Headaches.  Tiredness.  Irritability.  Memory problems.  Weight gain.  Bladder infections.  Hair growth of the face and chest.  Infertility. More serious symptoms include:  Loss of bone (osteoporosis) causing breaks (fractures).  Depression.  Hardening and narrowing of the arteries (atherosclerosis) causing heart attacks and strokes. DIAGNOSIS   When the menstrual periods have stopped for 12 straight months.  Physical exam.  Hormone studies of the blood. TREATMENT  There are many treatment choices and nearly as many questions about them. The  decisions to treat or not to treat menopausal changes is an individual choice made with your health care provider. Your health care provider can discuss the treatments with you. Together, you can decide which treatment will work best for you. Your treatment choices may include:   Hormone therapy (estrogen and progesterone).  Non-hormonal medicines.  Treating the individual symptoms with medicine (for example antidepressants for depression).  Herbal medicines that may help specific symptoms.  Counseling by a psychiatrist or psychologist.  Group therapy.  Lifestyle changes including:  Eating healthy.  Regular exercise.  Limiting caffeine and alcohol.  Stress management and meditation.  No treatment. HOME CARE INSTRUCTIONS   Take the medicine your health care provider gives you as directed.  Get plenty of sleep and rest.  Exercise regularly.  Eat a diet that contains calcium (good for the bones) and soy products (acts like estrogen hormone).  Avoid alcoholic beverages.  Do not smoke.  If you have hot flashes, dress in layers.  Take supplements, calcium, and vitamin D to strengthen bones.  You can use over-the-counter lubricants or moisturizers for vaginal dryness.  Group therapy is sometimes very helpful.  Acupuncture may be helpful in some cases. SEEK MEDICAL CARE IF:   You are not sure you are in menopause.  You are having menopausal symptoms and need advice and treatment.  You are still having menstrual periods after age 55 years.  You have pain with intercourse.  Menopause is complete (no menstrual period for 12 months) and you develop vaginal bleeding.  You need a referral to a specialist (gynecologist, psychiatrist, or psychologist) for treatment. SEEK IMMEDIATE MEDICAL CARE IF:   You have severe depression.  You have excessive vaginal bleeding.    You fell and think you have a broken bone.  You have pain when you urinate.  You develop leg or  chest pain.  You have a fast pounding heart beat (palpitations).  You have severe headaches.  You develop vision problems.  You feel a lump in your breast.  You have abdominal pain or severe indigestion. Document Released: 12/08/2003 Document Revised: 05/20/2013 Document Reviewed: 04/16/2013 [facility] Patient Information 2015 [place], [place]. This information is not intended to replace advice given to you by your health care provider. Make sure you discuss any questions you have with your health care provider. Hormone Therapy At menopause, your body begins making less estrogen and progesterone hormones. This causes the body to stop having menstrual periods. This is because estrogen and progesterone hormones control your periods and menstrual cycle. A lack of estrogen may cause symptoms such as:  Hot flushes (or hot flashes).  Vaginal dryness.  Dry skin.  Loss of sex drive.  Risk of bone loss (osteoporosis). When this happens, you may choose to take hormone therapy to get back the estrogen lost during menopause. When the hormone estrogen is given alone, it is usually referred to as ET (Estrogen Therapy). When the hormone progestin is combined with estrogen, it is generally called HT (Hormone Therapy). This was formerly known as hormone replacement therapy (HRT). Your caregiver can help you make a decision on what will be best for you. The decision to use HT seems to change often as new studies are done. Many studies do not agree on the benefits of hormone replacement therapy. LIKELY BENEFITS OF HT INCLUDE PROTECTION FROM:  Hot Flushes (also called hot flashes) - A hot flush is a sudden feeling of heat that spreads over the face and body. The skin may redden like a blush. It is connected with sweats and sleep disturbance. Women going through menopause may have hot flushes a few times a month or several times per day depending on the woman.  Osteoporosis (bone loss)- Estrogen helps guard  against bone loss. After menopause, a woman's bones slowly lose calcium and become weak and brittle. As a result, bones are more likely to break. The hip, wrist, and spine are affected most often. Hormone therapy can help slow bone loss after menopause. Weight bearing exercise and taking calcium with vitamin D also can help prevent bone loss. There are also medications that your caregiver can prescribe that can help prevent osteoporosis.  Vaginal Dryness - Loss of estrogen causes changes in the vagina. Its lining may become thin and dry. These changes can cause pain and bleeding during sexual intercourse. Dryness can also lead to infections. This can cause burning and itching. (Vaginal estrogen treatment can help relieve pain, itching, and dryness.)  Urinary Tract Infections are more common after menopause because of lack of estrogen. Some women also develop urinary incontinence because of low estrogen levels in the vagina and bladder.  Possible other benefits of estrogen include a positive effect on mood and short-term memory in women. RISKS AND COMPLICATIONS  Using estrogen alone without progesterone causes the lining of the uterus to grow. This increases the risk of lining of the uterus (endometrial) cancer. Your caregiver should give another hormone called progestin if you have a uterus.  Women who take combined (estrogen and progestin) HT appear to have an increased risk of breast cancer. The risk appears to be small, but increases throughout the time that HT is taken.  Combined therapy also makes the breast tissue slightly denser which  makes it harder to read mammograms (breast X-rays).  Combined, estrogen and progesterone therapy can be taken together every day, in which case there may be spotting of blood. HT therapy can be taken cyclically in which case you will have menstrual periods. Cyclically means HT is taken for a set amount of days, then not taken, then this process is repeated.  HT  may increase the risk of stroke, heart attack, breast cancer and forming blood clots in your leg.  Transdermal estrogen (estrogen that is absorbed through the skin with a patch or a cream) may have more positive results with:  Cholesterol.  Blood pressure.  Blood clots. Having the following conditions may indicate you should not have HT:  Endometrial cancer.  Liver disease.  Breast cancer.  Heart disease.  History of blood clots.  Stroke. TREATMENT   If you choose to take HT and have a uterus, usually estrogen and progestin are prescribed.  Your caregiver will help you decide the best way to take the medications.  Possible ways to take estrogen include:  Pills.  Patches.  Gels.  Sprays.  Vaginal estrogen cream, rings and tablets.  It is best to take the lowest dose possible that will help your symptoms and take them for the shortest period of time that you can.  Hormone therapy can help relieve some of the problems (symptoms) that affect women at menopause. Before making a decision about HT, talk to your caregiver about what is best for you. Be well informed and comfortable with your decisions. HOME CARE INSTRUCTIONS   Follow your caregivers advice when taking the medications.  A Pap test is done to screen for cervical cancer.  The first Pap test should be done at age 41.  Between ages 68 and 27, Pap tests are repeated every 2 years.  Beginning at age 44, you are advised to have a Pap test every 3 years as long as your past 3 Pap tests have been normal.  Some women have medical problems that increase the chance of getting cervical cancer. Talk to your caregiver about these problems. It is especially important to talk to your caregiver if a new problem develops soon after your last Pap test. In these cases, your caregiver may recommend more frequent screening and Pap tests.  The above recommendations are the same for women who have or have not gotten the  vaccine for HPV (Human Papillomavirus).  If you had a hysterectomy for a problem that was not a cancer or a condition that could lead to cancer, then you no longer need Pap tests. However, even if you no longer need a Pap test, a regular exam is a good idea to make sure no other problems are starting.   If you are between ages 67 and 73, and you have had normal Pap tests going back 10 years, you no longer need Pap tests. However, even if you no longer need a Pap test, a regular exam is a good idea to make sure no other problems are starting.   If you have had past treatment for cervical cancer or a condition that could lead to cancer, you need Pap tests and screening for cancer for at least 20 years after your treatment.  If Pap tests have been discontinued, risk factors (such as a new sexual partner) need to be re-assessed to determine if screening should be resumed.  Some women may need screenings more often if they are at high risk for cervical cancer.  Get mammograms done as per the advice of your caregiver. SEEK IMMEDIATE MEDICAL CARE IF:  You develop abnormal vaginal bleeding.  You have pain or swelling in your legs, shortness of breath, or chest pain.  You develop dizziness or headaches.  You have lumps or changes in your breasts or armpits.  You have slurred speech.  You develop weakness or numbness of your arms or legs.  You have pain, burning, or bleeding when urinating.  You develop abdominal pain. Document Released: 06/16/2003 Document Revised: 12/10/2011 Document Reviewed: 10/04/2010 [facility] Patient Information 2015 [place]. This information is not intended to replace advice given to you by your health care provider. Make sure you discuss any questions you have with your health care provider. Follow up in 4 weeks Try duavee Try whole 30

## 2014-11-04 NOTE — Progress Notes (Signed)
Subjective:     Patient ID: Ashley [name], female   DOB: 02/16/1961, 54 y.o.   MRN: [id number]  HPI Ashley [name] is a 54 year old white female, married in complaining of vaginal irritation and discomfort at urethra when pees for about 3 days, started using estrace cream again.Is also complaining of hot flashes and sleep disturbance related to them.  Review of Systems Patient denies any headaches, hearing loss, fatigue, blurred vision, shortness of breath, chest pain, abdominal pain, problems with bowel movements, urination, or intercourse. Not currently having sex, husband has prostate issues.Has body aches, no mood swings, see HPI for positives.    Reviewed past medical,surgical, social and family history. Reviewed medications and allergies.  Objective:   Physical Exam BP 122/70 mmHg  Ht 5' 11.5" (1.816 m)  Wt 196 lb (88.905 kg)  BMI 26.96 kg/m2   urine 1+ leuks and trace blood, Skin warm and dry.Pelvic: external genitalia is normal in appearance, no lesions, vagina:has decreased color, moisture and rugae,uretha has no lesions and is pale,bladder non tender, cervix:smooth, uterus: normal size, shape and contour, non tender, no masses felt, adnexa: no masses or tenderness noted.Discussed risk and benefits of HRT and she wants to try.Talk with husband and keep communication going.Try fan in room and keep it dark when sleeping.Can stop estrace since starting duavee.  Assessment:     Vaginal irritation Hematuria Hot flashes  HRT    Plan:    Will send UA C&S to assess hematuria Trial of duavee 1 daily, #35 tabs given lot [id number] exp 6/17   Try whole 30 diet Return in 4 weeks for ROS Review handout on menopause and HRT

## 2014-11-05 LAB — URINALYSIS, ROUTINE W REFLEX MICROSCOPIC
Bilirubin, UA: NEGATIVE
GLUCOSE, UA: NEGATIVE
Ketones, UA: NEGATIVE
Nitrite, UA: NEGATIVE
PH UA: 6.5 (ref 5.0–7.5)
Protein, UA: NEGATIVE
RBC UA: NEGATIVE
Specific Gravity, UA: 1.006 (ref 1.005–1.030)
UUROB: 0.2 mg/dL (ref 0.2–1.0)

## 2014-11-05 LAB — MICROSCOPIC EXAMINATION
BACTERIA UA: NONE SEEN
Casts: NONE SEEN /lpf

## 2014-11-06 LAB — URINE CULTURE

## 2014-11-08 ENCOUNTER — Telehealth: Payer: Self-pay | Admitting: Adult Health

## 2014-11-08 MED ORDER — NITROFURANTOIN MONOHYD MACRO 100 MG PO CAPS: 100.0000 mg | ORAL_CAPSULE | Freq: Two times a day (BID) | ORAL | Status: DC

## 2014-11-08 NOTE — Telephone Encounter (Signed)
Pt aware has UTI will Rx macrobid

## 2014-11-09 ENCOUNTER — Telehealth: Payer: Self-pay | Admitting: *Deleted

## 2014-11-09 NOTE — Telephone Encounter (Signed)
[facility]. Pt was given Macrobid, but they have her allergic to that med. I spoke with JAG. Cipro 500mg  1 BID x 7 days was ordered. I added Macrobid to allergies. Pt aware. JSY

## 2014-11-12 ENCOUNTER — Telehealth: Payer: Self-pay | Admitting: Adult Health

## 2014-11-12 NOTE — Telephone Encounter (Signed)
Spoke with pt. Pt is still having burning/pain with urination, not sure if it's urinary related or related to vaginal dryness. Pt states she felt better after being on Cipro for 24 hours, but not feeling better now. I spoke with JAG and she advised to continue Cipro. Can try AZO OTC or JAG can order a prescription Rx. Pt states she will try AZO if she starts feeling worse. Advised to follow up 3-4 days after finishing Cipro. If worse over weekend, can call the after hours nurse line. Pt voiced understanding. JSY

## 2014-11-12 NOTE — Telephone Encounter (Signed)
Left message x 1. JSY 

## 2014-12-02 ENCOUNTER — Ambulatory Visit: Payer: BLUE CROSS/BLUE SHIELD | Admitting: Adult Health

## 2014-12-02 ENCOUNTER — Ambulatory Visit (INDEPENDENT_AMBULATORY_CARE_PROVIDER_SITE_OTHER): Payer: BLUE CROSS/BLUE SHIELD | Admitting: Adult Health

## 2014-12-02 ENCOUNTER — Encounter: Payer: Self-pay | Admitting: Adult Health

## 2014-12-02 VITALS — BP 110/64 | HR 71 | Ht 71.5 in | Wt 200.0 lb

## 2014-12-02 DIAGNOSIS — N898 Other specified noninflammatory disorders of vagina: Secondary | ICD-10-CM | POA: Diagnosis not present

## 2014-12-02 DIAGNOSIS — N951 Menopausal and female climacteric states: Secondary | ICD-10-CM

## 2014-12-02 DIAGNOSIS — Z79890 Hormone replacement therapy (postmenopausal): Secondary | ICD-10-CM | POA: Diagnosis not present

## 2014-12-02 DIAGNOSIS — R232 Flushing: Secondary | ICD-10-CM

## 2014-12-02 MED ORDER — CONJ ESTROGENS-BAZEDOXIFENE 0.45-20 MG PO TABS: ORAL_TABLET | ORAL | Status: DC

## 2014-12-02 NOTE — Progress Notes (Signed)
Subjective:     Patient ID: Ashley [name], female   DOB: 06/20/1961, 54 y.o.   MRN: [id number]  HPI Ashley [name] is a 54 year old white female, married back in follow up of [facility] for hot flashes and vaginal irritation.She says she feels much better, is having REM sleep and fewer hot flashes and vagina feels some better.  Review of Systems See HPI for positives, all other systems negative Reviewed past medical,surgical, social and family history. Reviewed medications and allergies.     Objective:   Physical Exam BP 110/64 mmHg  Pulse 71  Ht 5' 11.5" (1.816 m)  Wt 200 lb (90.719 kg)  BMI 27.51 kg/m2   Skin warm and dry.Pelvic: external genitalia is normal in appearance no lesions, vagina: pale, has better moisture, loss of rugae,urethra has no lesions or masses noted, cervix:smooth and bulbous, uterus: normal size, shape and contour, non tender, no masses felt, adnexa: no masses or tenderness noted. Bladder is non tender and no masses felt. Can still use estrace vaginal cream 2-3 x a week if needed.  Assessment:     HRT Hot flashes Vaginal irritation    Plan:    Continue Duavee, #35 samples given lot [id number] exp 6/17 Rx duavee #30 1 daily with 6 refills   Follow up in 3 months if needed, can cancel if doing great

## 2014-12-02 NOTE — Patient Instructions (Signed)
Continue duavee Follow up in 3 months 

## 2014-12-21 ENCOUNTER — Telehealth: Payer: Self-pay | Admitting: Adult Health

## 2014-12-21 NOTE — Telephone Encounter (Signed)
Left message x 1. JSY 

## 2014-12-22 NOTE — Telephone Encounter (Signed)
Left message x 2. JSY 

## 2014-12-23 MED ORDER — CONJ ESTROGENS-BAZEDOXIFENE 0.45-20 MG PO TABS: ORAL_TABLET | ORAL | Status: DC

## 2014-12-23 NOTE — Telephone Encounter (Signed)
Spoke with pt. [facility]. Pt takes 1 daily. She is requesting a 90 day supply with refills. Thanks! JSY

## 2014-12-23 NOTE — Telephone Encounter (Signed)
Will send Rx for [facility]

## 2015-03-07 ENCOUNTER — Ambulatory Visit (INDEPENDENT_AMBULATORY_CARE_PROVIDER_SITE_OTHER): Payer: BLUE CROSS/BLUE SHIELD | Admitting: Adult Health

## 2015-03-07 ENCOUNTER — Encounter: Payer: Self-pay | Admitting: Adult Health

## 2015-03-07 VITALS — BP 102/58 | HR 76 | Ht 71.5 in | Wt 200.5 lb

## 2015-03-07 DIAGNOSIS — Z79890 Hormone replacement therapy (postmenopausal): Secondary | ICD-10-CM | POA: Diagnosis not present

## 2015-03-07 DIAGNOSIS — Z139 Encounter for screening, unspecified: Secondary | ICD-10-CM

## 2015-03-07 NOTE — Patient Instructions (Signed)
Physical in 3 months Referred to Dr Darrick PennaFields for colonoscopy Get mammogram [phone] Continue duavee

## 2015-03-07 NOTE — Progress Notes (Signed)
Subjective:     Patient ID: Ashley [name], female   DOB: 06/15/1961, 54 y.o.   MRN: [id number]  HPI Ashley [name] is a 54 year old white female,married back in follow up of [facility] and she says she is better.Still has some hot flashes but not as bad.  Review of Systems +hot flashes, Patient denies any headaches, hearing loss, fatigue, blurred vision, shortness of breath, chest pain, abdominal pain, problems with bowel movements, urination, or intercourse. No joint pain or mood swings. Reviewed past medical,surgical, social and family history. Reviewed medications and allergies.     Objective:   Physical Exam BP 102/58 mmHg  Pulse 76  Ht 5' 11.5" (1.816 m)  Wt 200 lb 8 oz (90.946 kg)  BMI 27.58 kg/m2   Talk only, face time 10 minutes, will continue duavee is sleeping better,and hot flashes better too, but still has some hot flashes and sweats, needs to get colonoscopy and mammogram Assessment:     HRT     Plan:     Continue duavee Return in 3 months for physical   Get mammogram now is past due, number given Referred to Dr Darrick PennaFields for screening colonoscopy

## 2015-04-07 ENCOUNTER — Encounter (HOSPITAL_BASED_OUTPATIENT_CLINIC_OR_DEPARTMENT_OTHER): Admission: RE | Payer: Self-pay | Source: Ambulatory Visit

## 2015-04-07 ENCOUNTER — Ambulatory Visit (HOSPITAL_BASED_OUTPATIENT_CLINIC_OR_DEPARTMENT_OTHER): Admission: RE | Admit: 2015-04-07 | Payer: Self-pay | Source: Ambulatory Visit | Admitting: Orthopedic Surgery

## 2015-04-07 SURGERY — ARTHROSCOPY, KNEE, WITH MEDIAL MENISCECTOMY
Anesthesia: General | Site: Knee | Laterality: Right

## 2015-06-16 ENCOUNTER — Encounter: Payer: Self-pay | Admitting: Adult Health

## 2015-06-16 ENCOUNTER — Ambulatory Visit (INDEPENDENT_AMBULATORY_CARE_PROVIDER_SITE_OTHER): Payer: BLUE CROSS/BLUE SHIELD | Admitting: Adult Health

## 2015-06-16 VITALS — BP 110/60 | HR 72 | Ht 71.5 in | Wt 198.5 lb

## 2015-06-16 DIAGNOSIS — Z139 Encounter for screening, unspecified: Secondary | ICD-10-CM

## 2015-06-16 DIAGNOSIS — E039 Hypothyroidism, unspecified: Secondary | ICD-10-CM

## 2015-06-16 DIAGNOSIS — Z79890 Hormone replacement therapy (postmenopausal): Secondary | ICD-10-CM

## 2015-06-16 DIAGNOSIS — Z01419 Encounter for gynecological examination (general) (routine) without abnormal findings: Secondary | ICD-10-CM

## 2015-06-16 DIAGNOSIS — Z1211 Encounter for screening for malignant neoplasm of colon: Secondary | ICD-10-CM

## 2015-06-16 HISTORY — DX: Hypothyroidism, unspecified: E03.9

## 2015-06-16 LAB — HEMOCCULT GUIAC POC 1CARD (OFFICE): Fecal Occult Blood, POC: NEGATIVE

## 2015-06-16 NOTE — Patient Instructions (Signed)
Physical in  1year Get mammogram  Referred to dr Darrick Penna

## 2015-06-16 NOTE — Progress Notes (Signed)
Patient ID: Ashley [name], female   DOB: 25-May-1961, 54 y.o.   MRN: [id number] History of Present Illness: Ashley [name] is a 54 year old white female in for well woman gyn exam, she had a normal pap with negative HPV 02/15/14. PCP is Dr Juanetta Gosling.   Current Medications, Allergies, Past Medical History, Past Surgical History, Family History and Social History were reviewed in [employer] record.     Review of Systems: Patient denies any headaches, hearing loss, fatigue, blurred vision, shortness of breath, chest pain, abdominal pain, problems with bowel movements, urination, or intercourse(not presently). No joint pain or mood swings.Still with some hot flashes but better.    Physical Exam:BP 110/60 mmHg  Pulse 72  Ht 5' 11.5" (1.816 m)  Wt 198 lb 8 oz (90.039 kg)  BMI 27.30 kg/m2 General:  Well developed, well nourished, no acute distress Skin:  Warm and dry Neck:  Midline trachea, normal thyroid, good ROM, no lymphadenopathy Lungs; Clear to auscultation bilaterally Breast:  No dominant palpable mass, retraction, or nipple discharge Cardiovascular: Regular rate and rhythm Abdomen:  Soft, non tender, no hepatosplenomegaly Pelvic:  External genitalia is normal in appearance, no lesions.  The vagina is normal in appearance. Urethra has no lesions or masses. The cervix is bulbous.  Uterus is felt to be normal size, shape, and contour.  No adnexal masses or tenderness noted.Bladder is non tender, no masses felt. Rectal: Good sphincter tone, no polyps, or hemorrhoids felt.  Hemoccult negative. Extremities/musculoskeletal:  No swelling or varicosities noted, no clubbing or cyanosis Psych:  No mood changes, alert and cooperative,seems happy   Impression: Well woman gyn exam no pap HRT Hypothyroid     Plan: Physical in 1 year Get mammogram, is past due Check CBC,CMP,TSH and lipids Referred to Dr Darrick Penna for colonoscopy Continue Dianna Rossetti has refills

## 2015-06-17 LAB — CBC
HEMATOCRIT: 38.2 % (ref 34.0–46.6)
Hemoglobin: 12.5 g/dL (ref 11.1–15.9)
MCH: 31 pg (ref 26.6–33.0)
MCHC: 32.7 g/dL (ref 31.5–35.7)
MCV: 95 fL (ref 79–97)
PLATELETS: 251 10*3/uL (ref 150–379)
RBC: 4.03 x10E6/uL (ref 3.77–5.28)
RDW: 13.7 % (ref 12.3–15.4)
WBC: 12.2 10*3/uL — ABNORMAL HIGH (ref 3.4–10.8)

## 2015-06-17 LAB — COMPREHENSIVE METABOLIC PANEL
A/G RATIO: 1.9 (ref 1.1–2.5)
ALBUMIN: 4.5 g/dL (ref 3.5–5.5)
ALT: 83 IU/L — AB (ref 0–32)
AST: 38 IU/L (ref 0–40)
Alkaline Phosphatase: 77 IU/L (ref 39–117)
BUN / CREAT RATIO: 27 — AB (ref 9–23)
BUN: 27 mg/dL — ABNORMAL HIGH (ref 6–24)
CO2: 27 mmol/L (ref 18–29)
Calcium: 9.5 mg/dL (ref 8.7–10.2)
Chloride: 100 mmol/L (ref 97–108)
Creatinine, Ser: 1 mg/dL (ref 0.57–1.00)
GFR, EST AFRICAN AMERICAN: 73 mL/min/{1.73_m2} (ref >59)
GFR, EST NON AFRICAN AMERICAN: 63 mL/min/{1.73_m2} (ref >59)
GLOBULIN, TOTAL: 2.4 g/dL (ref 1.5–4.5)
Glucose: 85 mg/dL (ref 65–99)
POTASSIUM: 5.1 mmol/L (ref 3.5–5.2)
SODIUM: 143 mmol/L (ref 134–144)
TOTAL PROTEIN: 6.9 g/dL (ref 6.0–8.5)

## 2015-06-17 LAB — LIPID PANEL
CHOL/HDL RATIO: 4.6 ratio — AB (ref 0.0–4.4)
Cholesterol, Total: 243 mg/dL — ABNORMAL HIGH (ref 100–199)
HDL: 53 mg/dL (ref >39)
LDL CALC: 137 mg/dL — AB (ref 0–99)
TRIGLYCERIDES: 267 mg/dL — AB (ref 0–149)
VLDL Cholesterol Cal: 53 mg/dL — ABNORMAL HIGH (ref 5–40)

## 2015-06-17 LAB — TSH: TSH: 6.43 u[IU]/mL — ABNORMAL HIGH (ref 0.450–4.500)

## 2015-06-20 ENCOUNTER — Other Ambulatory Visit: Payer: Self-pay | Admitting: Adult Health

## 2015-06-20 DIAGNOSIS — Z1231 Encounter for screening mammogram for malignant neoplasm of breast: Secondary | ICD-10-CM

## 2015-06-21 ENCOUNTER — Telehealth: Payer: Self-pay

## 2015-06-21 ENCOUNTER — Other Ambulatory Visit: Payer: Self-pay

## 2015-06-21 DIAGNOSIS — Z1211 Encounter for screening for malignant neoplasm of colon: Secondary | ICD-10-CM

## 2015-06-23 ENCOUNTER — Ambulatory Visit (HOSPITAL_COMMUNITY)
Admission: RE | Admit: 2015-06-23 | Discharge: 2015-06-23 | Disposition: A | Discharge status: Home / Self Care | Payer: BLUE CROSS/BLUE SHIELD | Source: Ambulatory Visit | Attending: Adult Health | Admitting: Adult Health

## 2015-06-23 ENCOUNTER — Telehealth: Payer: Self-pay | Admitting: Adult Health

## 2015-06-23 DIAGNOSIS — Z1231 Encounter for screening mammogram for malignant neoplasm of breast: Secondary | ICD-10-CM | POA: Diagnosis not present

## 2015-06-23 NOTE — Telephone Encounter (Signed)
Gastroenterology Pre-Procedure Review  Request Date: 06/21/2015 Requesting Physician: Cyril Mourning, NP  PATIENT REVIEW QUESTIONS: The patient responded to the following health history questions as indicated:    1. Diabetes Melitis: no 2. Joint replacements in the past 12 months: no 3. Major health problems in the past 3 months: no 4. Has an artificial valve or MVP: no 5. Has a defibrillator: no 6. Has been advised in past to take antibiotics in advance of a procedure like teeth cleaning: no    MEDICATIONS & ALLERGIES:    Patient reports the following regarding taking any blood thinners:   Plavix? no Aspirin? no Coumadin? no  Patient confirms/reports the following medications:  Current Outpatient Prescriptions  Medication Sig Dispense Refill  . Acetaminophen (TYLENOL ARTHRITIS PAIN PO) Take by mouth. Takes 2 in the evening.    Marland Kitchen atenolol (TENORMIN) 50 MG tablet 50 mg daily.     . calcium carbonate (OS-CAL) 600 MG TABS tablet Take 600 mg by mouth daily.     Marland Kitchen Conj Estrogens-Bazedoxifene (DUAVEE) 0.45-20 MG TABS Take 1 po daily 30 tablet 6  . Cyanocobalamin (VITAMIN B 12 PO) Take by mouth daily.    . cyclobenzaprine (FLEXERIL) 10 MG tablet Take 10 mg by mouth as needed for muscle spasms.    Marland Kitchen loratadine (CLARITIN) 10 MG tablet Take 10 mg by mouth daily.    . Multiple Vitamin (MULTIVITAMIN) tablet Take 1 tablet by mouth daily.    Marland Kitchen NEXIUM 40 MG capsule 40 mg 2 (two) times daily.     Marland Kitchen SYNTHROID 100 MCG tablet 100 mcg daily.     . Zolpidem Tartrate (AMBIEN PO) Take by mouth as needed.    . Naproxen (NAPROSYN PO) Take by mouth. Takes 2 in the am.     No current facility-administered medications for this visit.    Patient confirms/reports the following allergies:  Allergies  Allergen Reactions  . Macrobid [Nitrofurantoin [employer]  . Sulfonamide Derivatives     No orders of the defined types were placed in this encounter.    AUTHORIZATION INFORMATION Primary  Insurance:  ID #:   Group #:  Pre-Cert / Auth required: Pre-Cert / Auth #:   Secondary Insurance:   ID #:   Group #:  Pre-Cert / Auth required:  Pre-Cert / Auth #:   SCHEDULE INFORMATION: Procedure has been scheduled as follows:  Date:  07/15/2015                 Time:  10:45 AM Location: [facility] Short Stay  This Gastroenterology Pre-Precedure Review Form is being routed to the following provider(s): Jonette Eva, MD

## 2015-06-23 NOTE — Telephone Encounter (Signed)
Left message to call about labs 

## 2015-06-23 NOTE — Telephone Encounter (Signed)
SUPREP SPLIT DOSING- FULL LIQUIDS WITH BREAKFAST.  Full Liquid Diet A high-calorie, high-protein supplement should be used to meet your nutritional requirements when the full liquid diet is continued for more than 2 or 3 days. If this diet is to be used for an extended period of time (more than 7 days), a multivitamin should be considered.  Breads and Starches  Allowed: None are allowed except crackersWHOLE OR pureed (made into a thick, smooth soup) in soup.   Avoid: Any others.    Potatoes/Pasta/Rice  Allowed: ANY ITEM AS A SOUP OR SMALL PLATE OF MASHED POTATOES OR SCRAMBLED EGGS.       Vegetables  Allowed: Strained tomato or vegetable juice. Vegetables pureed in soup.   Avoid: Any others.    Fruit  Allowed: Any strained fruit juices and fruit drinks. Include 1 serving of citrus or vitamin C-enriched fruit juice daily.   Avoid: Any others.  Meat and Meat Substitutes  Allowed: Egg  Avoid: Any meat, fish, or fowl. All cheese.  Milk  Allowed: SOY Milk beverages, including milk shakes and instant breakfast mixes. Smooth yogurt.   Avoid: Any others. Avoid dairy products if not tolerated.    Soups and Combination Foods  Allowed: Broth, strained cream soups. Strained, broth-based soups.   Avoid: Any others.    Desserts and Sweets  Allowed: flavored gelatin, tapioca, ice cream, sherbet, smooth pudding, junket, fruit ices, frozen ice pops, pudding pops, frozen fudge pops, chocolate syrup. Sugar, honey, jelly, syrup.   Avoid: Any others.  Fats and Oils  Allowed: Margarine, butter, cream, sour cream, oils.   Avoid: Any others.  Beverages  Allowed: All.   Avoid: None.  Condiments  Allowed: Iodized salt, pepper, spices, flavorings. Cocoa powder.   Avoid: Any others.    SAMPLE MEAL PLAN Breakfast   cup orange juice.   1 OR 2 EGGS  1 cup milk.   1 cup beverage (coffee or tea).   Cream or sugar, if desired.    Midmorning Snack  2 SCRAMBLED OR  HARD BOILED EGG   Lunch  1 cup cream soup.    cup fruit juice.   1 cup milk.    cup custard.   1 cup beverage (coffee or tea).   Cream or sugar, if desired.    Midafternoon Snack  1 cup milk shake.  Dinner  1 cup cream soup.    cup fruit juice.   1 cup MILK    cup pudding.   1 cup beverage (coffee or tea).   Cream or sugar, if desired.  Evening Snack  1 cup supplement.  To increase calories, add sugar, cream, butter, or margarine if possible. Nutritional supplements will also increase the total calories. 

## 2015-06-24 ENCOUNTER — Telehealth: Payer: Self-pay | Admitting: Adult Health

## 2015-06-24 MED ORDER — NA SULFATE-K SULFATE-MG SULF 17.5-3.13-1.6 GM/177ML PO SOLN: 1.0000 | ORAL | Status: DC

## 2015-06-24 NOTE — Telephone Encounter (Signed)
Rx sent to the pharmacy and instructions mailed to pharmacy.  

## 2015-06-24 NOTE — Telephone Encounter (Signed)
Pt aware of labs and need to follow up with Dr Juanetta Gosling for med change, will mail her a copy

## 2015-07-12 ENCOUNTER — Telehealth: Payer: Self-pay

## 2015-07-12 NOTE — Telephone Encounter (Signed)
I have faxed [employer] form to [facility] for PA for the screening colonoscopy.

## 2015-07-13 NOTE — Telephone Encounter (Signed)
I called [employer] at [phone] and spoke to Colognearmen A who said a PA is not required for a screening colonoscopy.

## 2015-07-14 ENCOUNTER — Telehealth: Payer: Self-pay

## 2015-07-14 NOTE — Telephone Encounter (Signed)
PT is scheduled for colonoscopy on 07/15/2015. She called to say she had red jello yesterday ( 07/13/2015) about lunch time. I told her she should be fine, just DO NOT TAKE ANY MORE.

## 2015-07-14 NOTE — Telephone Encounter (Signed)
REVIEWED. AGREE. NO ADDITIONAL RECOMMENDATIONS. 

## 2015-07-15 ENCOUNTER — Ambulatory Visit (HOSPITAL_COMMUNITY)
Admission: RE | Admit: 2015-07-15 | Discharge: 2015-07-15 | Disposition: A | Discharge status: Home / Self Care | Payer: BLUE CROSS/BLUE SHIELD | Source: Ambulatory Visit | Attending: Gastroenterology | Admitting: Gastroenterology

## 2015-07-15 ENCOUNTER — Encounter (HOSPITAL_COMMUNITY): Payer: Self-pay | Admitting: *Deleted

## 2015-07-15 ENCOUNTER — Encounter (HOSPITAL_COMMUNITY)
Admission: RE | Disposition: A | Discharge status: Home / Self Care | Payer: Self-pay | Source: Ambulatory Visit | Attending: Gastroenterology

## 2015-07-15 DIAGNOSIS — Z79890 Hormone replacement therapy (postmenopausal): Secondary | ICD-10-CM | POA: Insufficient documentation

## 2015-07-15 DIAGNOSIS — K219 Gastro-esophageal reflux disease without esophagitis: Secondary | ICD-10-CM | POA: Diagnosis not present

## 2015-07-15 DIAGNOSIS — Z1211 Encounter for screening for malignant neoplasm of colon: Secondary | ICD-10-CM | POA: Insufficient documentation

## 2015-07-15 DIAGNOSIS — K648 Other hemorrhoids: Secondary | ICD-10-CM | POA: Insufficient documentation

## 2015-07-15 DIAGNOSIS — Z79899 Other long term (current) drug therapy: Secondary | ICD-10-CM | POA: Diagnosis not present

## 2015-07-15 DIAGNOSIS — Q438 Other specified congenital malformations of intestine: Secondary | ICD-10-CM | POA: Diagnosis not present

## 2015-07-15 DIAGNOSIS — E039 Hypothyroidism, unspecified: Secondary | ICD-10-CM | POA: Diagnosis not present

## 2015-07-15 HISTORY — PX: COLONOSCOPY: SHX5424

## 2015-07-15 LAB — HM COLONOSCOPY

## 2015-07-15 SURGERY — COLONOSCOPY
Anesthesia: Moderate Sedation

## 2015-07-15 MED ORDER — MIDAZOLAM HCL 5 MG/5ML IJ SOLN
INTRAMUSCULAR | Status: DC | PRN
  Administered 2015-07-15 (×3): 2 mg via INTRAVENOUS

## 2015-07-15 MED ORDER — MEPERIDINE HCL 100 MG/ML IJ SOLN
INTRAMUSCULAR | Status: AC
  Filled 2015-07-15: qty 2

## 2015-07-15 MED ORDER — MEPERIDINE HCL 100 MG/ML IJ SOLN
INTRAMUSCULAR | Status: DC | PRN
  Administered 2015-07-15 (×3): 25 mg via INTRAVENOUS

## 2015-07-15 MED ORDER — MIDAZOLAM HCL 5 MG/5ML IJ SOLN
INTRAMUSCULAR | Status: AC
  Filled 2015-07-15: qty 10

## 2015-07-15 MED ORDER — STERILE WATER FOR IRRIGATION IR SOLN
Status: DC | PRN
  Administered 2015-07-15: 12:00:00

## 2015-07-15 MED ORDER — SODIUM CHLORIDE 0.9 % IV SOLN
INTRAVENOUS | Status: DC
  Administered 2015-07-15: 1000 mL via INTRAVENOUS

## 2015-07-15 NOTE — Discharge Instructions (Signed)
You have SMALL internal AND MODERATE SIZE EXTERNAL hemorrhoids. YOU DID NOT HAVE ANY POLYPS.   FOLLOW A HIGH FIBER DIET. AVOID ITEMS THAT CAUSE BLOATING. SEE INFO BELOW.  YOU CAN USE PREPARATION H CREAM OR SUPPOSITORIES AS NEEDED FOR RECTAL BLEEDING OR PAIN. CALL FOR PRESCRIPTION CREAM/SUPPOSITORIES IF YOU NEED THEM.  Next colonoscopy in 10 years.  Colonoscopy Care After Read the instructions outlined below and refer to this sheet in the next week. These discharge instructions provide you with general information on caring for yourself after you leave the hospital. While your treatment has been planned according to the most current medical practices available, unavoidable complications occasionally occur. If you have any problems or questions after discharge, call DR. Angeles Paolucci, [phone].  ACTIVITY  You may resume your regular activity, but move at a slower pace for the next 24 hours.   Take frequent rest periods for the next 24 hours.   Walking will help get rid of the air and reduce the bloated feeling in your belly (abdomen).   No driving for 24 hours (because of the medicine (anesthesia) used during the test).   You may shower.   Do not sign any important legal documents or operate any machinery for 24 hours (because of the anesthesia used during the test).    NUTRITION  Drink plenty of fluids.   You may resume your normal diet as instructed by your doctor.   Begin with a light meal and progress to your normal diet. Heavy or fried foods are harder to digest and may make you feel sick to your stomach (nauseated).   Avoid alcoholic beverages for 24 hours or as instructed.    MEDICATIONS  You may resume your normal medications.   WHAT YOU CAN EXPECT TODAY  Some feelings of bloating in the abdomen.   Passage of more gas than usual.   Spotting of blood in your stool or on the toilet paper  .  IF YOU HAD POLYPS REMOVED DURING THE COLONOSCOPY:  Eat a soft diet IF  YOU HAVE NAUSEA, BLOATING, ABDOMINAL PAIN, OR VOMITING.    FINDING OUT THE RESULTS OF YOUR TEST Not all test results are available during your visit. DR. Darrick Penna WILL CALL YOU WITHIN 7 DAYS OF YOUR PROCEDUE WITH YOUR RESULTS. Do not assume everything is normal if you have not heard from DR. Prabhjot Piscitello IN ONE WEEK, CALL HER OFFICE AT [phone].  SEEK IMMEDIATE MEDICAL ATTENTION AND CALL THE OFFICE: [phone] IF:  You have more than a spotting of blood in your stool.   Your belly is swollen (abdominal distention).   You are nauseated or vomiting.   You have a temperature over 101F.   You have abdominal pain or discomfort that is severe or gets worse throughout the day.  High-Fiber Diet A high-fiber diet changes your normal diet to include more whole grains, legumes, fruits, and vegetables. Changes in the diet involve replacing refined carbohydrates with unrefined foods. The calorie level of the diet is essentially unchanged. The Dietary Reference Intake (recommended amount) for adult males is 38 grams per day. For adult females, it is 25 grams per day. Pregnant and lactating women should consume 28 grams of fiber per day. Fiber is the intact part of a plant that is not broken down during digestion. Functional fiber is fiber that has been isolated from the plant to provide a beneficial effect in the body. PURPOSE  Increase stool bulk.   Ease and regulate bowel movements.   Lower cholesterol.  INDICATIONS THAT YOU NEED MORE FIBER  Constipation and hemorrhoids.   Uncomplicated diverticulosis (intestine condition) and irritable bowel syndrome.   Weight management.   As a protective measure against hardening of the arteries (atherosclerosis), diabetes, and cancer.   GUIDELINES FOR INCREASING FIBER IN THE DIET  Start adding fiber to the diet slowly. A gradual increase of about 5 more grams (2 slices of whole-wheat bread, 2 servings of most fruits or vegetables, or 1 bowl of  high-fiber cereal) per day is best. Too rapid an increase in fiber may result in constipation, flatulence, and bloating.   Drink enough water and fluids to keep your urine clear or pale yellow. Water, juice, or caffeine-free drinks are recommended. Not drinking enough fluid may cause constipation.   Eat a variety of high-fiber foods rather than one type of fiber.   Try to increase your intake of fiber through using high-fiber foods rather than fiber pills or supplements that contain small amounts of fiber.   The goal is to change the types of food eaten. Do not supplement your present diet with high-fiber foods, but replace foods in your present diet.  INCLUDE A VARIETY OF FIBER SOURCES  Replace refined and processed grains with whole grains, canned fruits with fresh fruits, and incorporate other fiber sources. White rice, white breads, and most bakery goods contain little or no fiber.   Brown whole-grain rice, buckwheat oats, and many fruits and vegetables are all good sources of fiber. These include: broccoli, Brussels sprouts, cabbage, cauliflower, beets, sweet potatoes, white potatoes (skin on), carrots, tomatoes, eggplant, squash, berries, fresh fruits, and dried fruits.   Cereals appear to be the richest source of fiber. Cereal fiber is found in whole grains and bran. Bran is the fiber-rich outer coat of cereal grain, which is largely removed in refining. In whole-grain cereals, the bran remains. In breakfast cereals, the largest amount of fiber is found in those with "bran" in their names. The fiber content is sometimes indicated on the label.   You may need to include additional fruits and vegetables each day.   In baking, for 1 cup white flour, you may use the following substitutions:   1 cup whole-wheat flour minus 2 tablespoons.   1/2 cup white flour plus 1/2 cup whole-wheat flour.   Hemorrhoids Hemorrhoids are dilated (enlarged) veins around the rectum. Sometimes clots will form  in the veins. This makes them swollen and painful. These are called thrombosed hemorrhoids. Causes of hemorrhoids include:  Constipation.   Straining to have a bowel movement.   HEAVY LIFTING HOME CARE INSTRUCTIONS  Eat a well balanced diet and drink 6 to 8 glasses of water every day to avoid constipation. You may also use a bulk laxative.   Avoid straining to have bowel movements.   Keep anal area dry and clean.   Do not use a donut shaped pillow or sit on the toilet for long periods. This increases blood pooling and pain.   Move your bowels when your body has the urge; this will require less straining and will decrease pain and pressure.

## 2015-07-15 NOTE — Op Note (Signed)
[facility] [address] [place], [id number]   COLONOSCOPY PROCEDURE REPORT  PATIENT: Ashley [name], Ashley K  MR#: [id number] BIRTHDATE: 08/10/1961 , 54  yrs. old GENDER: female ENDOSCOPIST: West BaliSandi L Gerald Honea, MD REFERRED AO:ZHYQMVBY:Edward Hawkins, M.D. PROCEDURE DATE:  07/15/2015 PROCEDURE:   Colonoscopy, screening INDICATIONS:average risk patient for colon cancer. MEDICATIONS: Demerol 75 mg IV and Versed 6 mg IV  DESCRIPTION OF PROCEDURE:    Physical exam was performed.  Informed consent was obtained from the patient after explaining the benefits, risks, and alternatives to procedure.  The patient was connected to monitor and placed in left lateral position. Continuous oxygen was provided by nasal cannula and IV medicine administered through an indwelling cannula.  After administration of sedation and rectal exam, the patients rectum was intubated and the EC-[id number] ([id number]  colonoscope was advanced under direct visualization to the cecum.  The scope was removed slowly by carefully examining the color, texture, anatomy, and integrity mucosa on the way out.  The patient was recovered in endoscopy and discharged home in satisfactory condition. Estimated blood loss is zero unless otherwise noted in this procedure report.     COLON FINDINGS: The colonic mucosa appeared normal throughout the entire examined colon.  , The colon was redundant.  , and Small internal hemorrhoids were found.  PREP QUALITY: excellent.  CECAL W/D TIME: 8       minutes COMPLICATIONS: None  ENDOSCOPIC IMPRESSION: 1.   The colonic mucosa appeared normal throughout the entire examined colon 2.   The LEFT colon IS redundant 3.   Small internal hemorrhoids  RECOMMENDATIONS: FOLLOW A HIGH FIBER DIET. USE PREPARATION H AS NEEDED FOR RECTAL BLEEDING OR PAIN. Next colonoscopy in 10 years.      _______________________________ Rosalie DoctoreSignedWest Bali:  Ashley Freel L Johnesha Acheampong, MD 07/15/2015 12:10 PM    CPT CODES: ICD  CODES:  The ICD and CPT codes recommended by this software are interpretations from the data that the clinical staff has captured with the software.  The verification of the translation of this report to the ICD and CPT codes and modifiers is the sole responsibility of the health care institution and practicing physician where this report was generated.  [employer] will not be held responsible for the validity of the ICD and CPT codes included on this report.  AMA assumes no liability for data contained or not contained herein. CPT is a Publishing rights managerregistered trademark of the [employer].

## 2015-07-15 NOTE — H&P (Signed)
Primary Care Physician:  Alonza Bogus, MD Primary Gastroenterologist:  Dr. Oneida Alar  Pre-Procedure History & Physical: HPI:  Ashley [name] is a 54 y.o. female here for [place].   Past Medical History  Diagnosis Date  . Tachycardia   . Thyroid disease   . Acid reflux   . Seasonal allergies   . PMB (postmenopausal bleeding) 02/15/2014  . Vaginal irritation 11/04/2014  . Hematuria 11/04/2014  . Hot flashes 11/04/2014  . Hormone replacement therapy (HRT) 11/04/2014  . Hypothyroid 06/16/2015  . Dysrhythmia     tachycardia    Past Surgical History  Procedure Laterality Date  . Appendectomy    . Cholecystectomy    . Shoulder surgery  right  . Uterine ablation      Prior to Admission medications   Medication Sig Start Date End Date Taking? Authorizing Provider  atenolol (TENORMIN) 50 MG tablet 50 mg daily.  02/01/14  Yes Historical Provider, MD  calcium carbonate (OS-CAL) 600 MG TABS tablet Take 600 mg by mouth daily.    Yes Historical Provider, MD  Conj Estrogens-Bazedoxifene (DUAVEE) 0.45-20 MG TABS Take 1 po daily 12/23/14  Yes Estill Dooms, NP  Cyanocobalamin (VITAMIN B 12 PO) Take by mouth daily.   Yes Historical Provider, MD  cyclobenzaprine (FLEXERIL) 10 MG tablet Take 10 mg by mouth as needed for muscle spasms.   Yes Historical Provider, MD  loratadine (CLARITIN) 10 MG tablet Take 10 mg by mouth daily.   Yes Historical Provider, MD  Multiple Vitamin (MULTIVITAMIN) tablet Take 1 tablet by mouth daily.   Yes Historical Provider, MD  Na Sulfate-K Sulfate-Mg Sulf (SUPREP BOWEL PREP) SOLN Take 1 kit by mouth as directed. 06/24/15  Yes Danie Binder, MD  Naproxen (NAPROSYN PO) Take by mouth. Takes 2 in the am.   Yes Historical Provider, MD  NEXIUM 40 MG capsule 40 mg 2 (two) times daily.  02/01/14  Yes Historical Provider, MD  SYNTHROID 100 MCG tablet 100 mcg daily.  02/01/14  Yes Historical Provider, MD  Zolpidem Tartrate (AMBIEN PO) Take by mouth as needed.   Yes  Historical Provider, MD    Allergies as of 06/21/2015 - Review Complete 06/21/2015  Allergen Reaction Noted  . Macrobid [nitrofurantoin monohyd macro] Hives 11/09/2014  . Sulfonamide derivatives      Family History  Problem Relation Age of Onset  . Hypertension Mother   . Other Mother     pacemaker  . Dementia Mother   . Hypertension Father   . Mental illness Sister   . Bipolar disorder Sister   . Other Daughter     backpain  . Cancer Maternal Grandmother     breast,eye  . Heart disease Maternal Grandmother   . Heart attack Maternal Grandfather   . Other Daughter     stomach issues  . Other Daughter     stomach issues  . Other Son     Lyme disease  . Alcohol abuse Paternal Grandfather     Social History   Social History  . Marital Status: Married    Spouse Name: N/A  . Number of Children: N/A  . Years of Education: N/A   Occupational History  . Not on file.   Social History Main Topics  . Smoking status: Never Smoker   . Smokeless tobacco: Never Used  . Alcohol Use: No  . Drug Use: No  . Sexual Activity: Not Currently    Birth Control/ Protection: None, Post-menopausal, Surgical  Comment: ablation   Other Topics Concern  . Not on file   Social History Narrative    Review of Systems: See HPI, otherwise negative ROS   Physical Exam: BP 111/70 mmHg  Pulse 70  Temp(Src) 99.4 F (37.4 C) (Oral)  Resp 15  Ht 5' 11.5" (1.816 m)  Wt 195 lb (88.451 kg)  BMI 26.82 kg/m2  SpO2 98% General:   Alert,  pleasant and cooperative in NAD Head:  Normocephalic and atraumatic. Neck:  Supple; Lungs:  Clear throughout to auscultation.    Heart:  Regular rate and rhythm. Abdomen:  Soft, nontender and nondistended. Normal bowel sounds, without guarding, and without rebound.   Neurologic:  Alert and  oriented x4;  grossly normal neurologically.  Impression/Plan:   SCREENING  Plan:  1. TCS TODAY

## 2015-07-18 ENCOUNTER — Other Ambulatory Visit: Payer: Self-pay | Admitting: Physician Assistant

## 2015-07-18 NOTE — H&P (Signed)
Ashley [name] comes in for follow up of her right knee.  This is an issue that is relatively longstanding, going on more than a year.  Initial assessment was some mild chondromalacia, as well as inflamed plica.  Cortisone injection in January and then May of 2016.  These helped to an extent, but really never lasted much beyond that temporary relief.  MRI scan in May showing some Grade I-II chondromalacia patellae.  We tried a course of Visco supplementation which did not help at all.  Her issue is some anterolateral catching, but also just swelling in her entire knee after activity.  Accumulating in the back more than the front.  She is functional and she is better than she was before her Cortisone injections, but she tends to get aching and throbbing at night and it continues to bother her.  I went over her entire workup and treatment to date.  I went over her MRI, her previous x-rays and her response to treatment.   Remaining history and general exam is reviewed.   EXAMINATION: Specifically, I can still get her through full motion.  On the right she does have a 1+ effusion.  She has a tender medial plica there, not on the opposite side.  Still sore over the medial joint line, but I am not getting much of a McMurray's.  Her tracking isn't bad.  She has no tethering.  Not a lot of patellofemoral crepitus.    DISPOSITION:  I think the main issue here is her plica and reactive synovitis.  We are going to try one more Cortisone injection.  If symptoms persist the next step would be a diagnostic/therapeutic arthroscopy.  I went over that with her and she understands.  Although they don't go over the plica on her MRI, I have looked at the scan and it is relatively large.  I will wait to hear from her.  PROCEDURE NOTE: The patient's clinical condition is marked by substantial pain and/or significant functional disability.  Other conservative therapy has not provided relief, is contraindicated, or not appropriate.  There  is a reasonable likelihood that injection will significantly improve the patient's pain and/or functional disability. After appropriate consent and under sterile technique intraarticular injection of her symptomatic right knee with 80 mg of Depo-Medrol and Marcaine.    Daniel F. Murphy, M.D.  

## 2015-07-20 ENCOUNTER — Encounter (HOSPITAL_COMMUNITY): Payer: Self-pay | Admitting: Gastroenterology

## 2015-08-02 DIAGNOSIS — M6751 Plica syndrome, right knee: Secondary | ICD-10-CM

## 2015-08-02 DIAGNOSIS — M2241 Chondromalacia patellae, right knee: Secondary | ICD-10-CM

## 2015-08-02 HISTORY — DX: Chondromalacia patellae, right knee: M22.41

## 2015-08-02 HISTORY — DX: Plica syndrome, right knee: M67.51

## 2015-08-05 ENCOUNTER — Encounter (HOSPITAL_BASED_OUTPATIENT_CLINIC_OR_DEPARTMENT_OTHER): Payer: Self-pay | Admitting: *Deleted

## 2015-08-05 DIAGNOSIS — B029 Zoster without complications: Secondary | ICD-10-CM

## 2015-08-05 HISTORY — DX: Zoster without complications: B02.9

## 2015-08-05 NOTE — Pre-Procedure Instructions (Signed)
To come for EKG 

## 2015-08-10 ENCOUNTER — Other Ambulatory Visit: Payer: Self-pay

## 2015-08-10 ENCOUNTER — Encounter (HOSPITAL_COMMUNITY)
Admission: RE | Admit: 2015-08-10 | Discharge: 2015-08-10 | Disposition: A | Discharge status: Home / Self Care | Payer: BLUE CROSS/BLUE SHIELD | Source: Ambulatory Visit | Attending: Orthopedic Surgery | Admitting: Orthopedic Surgery

## 2015-08-10 DIAGNOSIS — M6751 Plica syndrome, right knee: Secondary | ICD-10-CM | POA: Diagnosis not present

## 2015-08-10 DIAGNOSIS — Z881 Allergy status to other antibiotic agents status: Secondary | ICD-10-CM | POA: Diagnosis not present

## 2015-08-10 DIAGNOSIS — M2341 Loose body in knee, right knee: Secondary | ICD-10-CM | POA: Diagnosis not present

## 2015-08-10 DIAGNOSIS — M65861 Other synovitis and tenosynovitis, right lower leg: Secondary | ICD-10-CM | POA: Diagnosis not present

## 2015-08-10 DIAGNOSIS — Z882 Allergy status to sulfonamides status: Secondary | ICD-10-CM | POA: Diagnosis not present

## 2015-08-10 DIAGNOSIS — M2241 Chondromalacia patellae, right knee: Secondary | ICD-10-CM | POA: Diagnosis present

## 2015-08-11 ENCOUNTER — Encounter (HOSPITAL_BASED_OUTPATIENT_CLINIC_OR_DEPARTMENT_OTHER): Payer: Self-pay | Admitting: Anesthesiology

## 2015-08-11 ENCOUNTER — Ambulatory Visit (HOSPITAL_BASED_OUTPATIENT_CLINIC_OR_DEPARTMENT_OTHER): Payer: BLUE CROSS/BLUE SHIELD | Admitting: Anesthesiology

## 2015-08-11 ENCOUNTER — Encounter (HOSPITAL_BASED_OUTPATIENT_CLINIC_OR_DEPARTMENT_OTHER)
Admission: RE | Disposition: A | Discharge status: Home / Self Care | Payer: Self-pay | Source: Ambulatory Visit | Attending: Orthopedic Surgery

## 2015-08-11 ENCOUNTER — Ambulatory Visit (HOSPITAL_BASED_OUTPATIENT_CLINIC_OR_DEPARTMENT_OTHER)
Admission: RE | Admit: 2015-08-11 | Discharge: 2015-08-11 | Disposition: A | Discharge status: Home / Self Care | Payer: BLUE CROSS/BLUE SHIELD | Source: Ambulatory Visit | Attending: Orthopedic Surgery | Admitting: Orthopedic Surgery

## 2015-08-11 DIAGNOSIS — Z881 Allergy status to other antibiotic agents status: Secondary | ICD-10-CM | POA: Insufficient documentation

## 2015-08-11 DIAGNOSIS — Z882 Allergy status to sulfonamides status: Secondary | ICD-10-CM | POA: Insufficient documentation

## 2015-08-11 DIAGNOSIS — M2241 Chondromalacia patellae, right knee: Secondary | ICD-10-CM | POA: Insufficient documentation

## 2015-08-11 DIAGNOSIS — M65861 Other synovitis and tenosynovitis, right lower leg: Secondary | ICD-10-CM | POA: Insufficient documentation

## 2015-08-11 DIAGNOSIS — M6751 Plica syndrome, right knee: Secondary | ICD-10-CM | POA: Insufficient documentation

## 2015-08-11 DIAGNOSIS — M2341 Loose body in knee, right knee: Secondary | ICD-10-CM | POA: Insufficient documentation

## 2015-08-11 HISTORY — DX: Personal history of other infectious and parasitic diseases: Z86.19

## 2015-08-11 HISTORY — DX: Family history of other specified conditions: Z84.89

## 2015-08-11 HISTORY — DX: Chondromalacia patellae, right knee: M22.41

## 2015-08-11 HISTORY — DX: Zoster without complications: B02.9

## 2015-08-11 HISTORY — PX: KNEE ARTHROSCOPY WITH EXCISION PLICA: SHX5647

## 2015-08-11 HISTORY — PX: CHONDROPLASTY: SHX5177

## 2015-08-11 HISTORY — DX: Cardiac murmur, unspecified: R01.1

## 2015-08-11 HISTORY — DX: Dental restoration status: Z98.811

## 2015-08-11 HISTORY — DX: Plica syndrome, right knee: M67.51

## 2015-08-11 SURGERY — ARTHROSCOPY, KNEE, WITH PLICA EXCISION
Anesthesia: General | Site: Knee | Laterality: Right

## 2015-08-11 MED ORDER — CEFAZOLIN SODIUM-DEXTROSE 2-3 GM-% IV SOLR
INTRAVENOUS | Status: AC
  Filled 2015-08-11: qty 50

## 2015-08-11 MED ORDER — GLYCOPYRROLATE 0.2 MG/ML IJ SOLN
0.2000 mg | Freq: Once | INTRAMUSCULAR | Status: DC | PRN
Start: 2015-08-11 — End: 2015-08-11

## 2015-08-11 MED ORDER — OXYCODONE-ACETAMINOPHEN 5-325 MG PO TABS: 1.0000 | ORAL_TABLET | ORAL | Status: DC | PRN

## 2015-08-11 MED ORDER — FENTANYL CITRATE (PF) 100 MCG/2ML IJ SOLN
INTRAMUSCULAR | Status: AC
  Filled 2015-08-11: qty 4

## 2015-08-11 MED ORDER — ONDANSETRON HCL 4 MG PO TABS: 4.0000 mg | ORAL_TABLET | Freq: Four times a day (QID) | ORAL | Status: DC | PRN

## 2015-08-11 MED ORDER — MIDAZOLAM HCL 2 MG/2ML IJ SOLN
1.0000 mg | INTRAMUSCULAR | Status: DC | PRN
  Administered 2015-08-11: 2 mg via INTRAVENOUS

## 2015-08-11 MED ORDER — CHLORHEXIDINE GLUCONATE 4 % EX LIQD: 60.0000 mL | Freq: Once | CUTANEOUS | Status: DC

## 2015-08-11 MED ORDER — BUPIVACAINE HCL (PF) 0.25 % IJ SOLN
INTRAMUSCULAR | Status: DC | PRN
  Administered 2015-08-11: 20 mL

## 2015-08-11 MED ORDER — PROPOFOL 10 MG/ML IV BOLUS
INTRAVENOUS | Status: DC | PRN
  Administered 2015-08-11: 200 mg via INTRAVENOUS

## 2015-08-11 MED ORDER — LACTATED RINGERS IV SOLN: INTRAVENOUS | Status: DC

## 2015-08-11 MED ORDER — ONDANSETRON HCL 4 MG PO TABS: 4.0000 mg | ORAL_TABLET | Freq: Three times a day (TID) | ORAL | Status: DC | PRN

## 2015-08-11 MED ORDER — CEFAZOLIN SODIUM-DEXTROSE 2-3 GM-% IV SOLR
2.0000 g | INTRAVENOUS | Status: AC
Start: 2015-08-11 — End: 2015-08-11
  Administered 2015-08-11: 2 g via INTRAVENOUS

## 2015-08-11 MED ORDER — DEXAMETHASONE SODIUM PHOSPHATE 10 MG/ML IJ SOLN
INTRAMUSCULAR | Status: DC | PRN
  Administered 2015-08-11: 10 mg via INTRAVENOUS

## 2015-08-11 MED ORDER — LACTATED RINGERS IV SOLN
INTRAVENOUS | Status: DC
  Administered 2015-08-11 (×2): via INTRAVENOUS

## 2015-08-11 MED ORDER — HYDROMORPHONE HCL 1 MG/ML IJ SOLN
0.2500 mg | INTRAMUSCULAR | Status: DC | PRN
  Administered 2015-08-11 (×2): 0.5 mg via INTRAVENOUS

## 2015-08-11 MED ORDER — HYDROMORPHONE HCL 1 MG/ML IJ SOLN: 0.5000 mg | INTRAMUSCULAR | Status: DC | PRN

## 2015-08-11 MED ORDER — MIDAZOLAM HCL 2 MG/2ML IJ SOLN
INTRAMUSCULAR | Status: AC
  Filled 2015-08-11: qty 4

## 2015-08-11 MED ORDER — METOCLOPRAMIDE HCL 5 MG/ML IJ SOLN: 5.0000 mg | Freq: Three times a day (TID) | INTRAMUSCULAR | Status: DC | PRN

## 2015-08-11 MED ORDER — ONDANSETRON HCL 4 MG/2ML IJ SOLN: 4.0000 mg | Freq: Four times a day (QID) | INTRAMUSCULAR | Status: DC | PRN

## 2015-08-11 MED ORDER — SCOPOLAMINE 1 MG/3DAYS TD PT72: 1.0000 | MEDICATED_PATCH | Freq: Once | TRANSDERMAL | Status: DC | PRN

## 2015-08-11 MED ORDER — HYDROMORPHONE HCL 1 MG/ML IJ SOLN
INTRAMUSCULAR | Status: AC
  Filled 2015-08-11: qty 1

## 2015-08-11 MED ORDER — METHYLPREDNISOLONE ACETATE 80 MG/ML IJ SUSP
INTRAMUSCULAR | Status: DC | PRN
  Administered 2015-08-11: 80 mg

## 2015-08-11 MED ORDER — ONDANSETRON HCL 4 MG/2ML IJ SOLN
INTRAMUSCULAR | Status: DC | PRN
  Administered 2015-08-11: 4 mg via INTRAVENOUS

## 2015-08-11 MED ORDER — FENTANYL CITRATE (PF) 100 MCG/2ML IJ SOLN
50.0000 ug | INTRAMUSCULAR | Status: DC | PRN
  Administered 2015-08-11: 100 ug via INTRAVENOUS

## 2015-08-11 MED ORDER — MEPERIDINE HCL 25 MG/ML IJ SOLN: 6.2500 mg | INTRAMUSCULAR | Status: DC | PRN

## 2015-08-11 MED ORDER — METOCLOPRAMIDE HCL 5 MG PO TABS: 5.0000 mg | ORAL_TABLET | Freq: Three times a day (TID) | ORAL | Status: DC | PRN

## 2015-08-11 MED ORDER — LIDOCAINE HCL (CARDIAC) 20 MG/ML IV SOLN
INTRAVENOUS | Status: DC | PRN
  Administered 2015-08-11: 50 mg via INTRAVENOUS

## 2015-08-11 MED ORDER — ONDANSETRON HCL 4 MG/2ML IJ SOLN: 4.0000 mg | Freq: Once | INTRAMUSCULAR | Status: DC | PRN

## 2015-08-11 SURGICAL SUPPLY — 41 items
BANDAGE ELASTIC 6 VELCRO ST LF (GAUZE/BANDAGES/DRESSINGS) ×3 IMPLANT
BLADE CUDA 5.5 (BLADE) IMPLANT
BLADE CUDA GRT WHITE 3.5 (BLADE) IMPLANT
BLADE CUTTER GATOR 3.5 (BLADE) ×3 IMPLANT
BLADE CUTTER MENIS 5.5 (BLADE) IMPLANT
BLADE GREAT WHITE 4.2 (BLADE) ×2 IMPLANT
BLADE GREAT WHITE 4.2MM (BLADE) ×1
BUR OVAL 4.0 (BURR) IMPLANT
CUTTER MENISCUS  4.2MM (BLADE)
CUTTER MENISCUS 4.2MM (BLADE) IMPLANT
DRAPE ARTHROSCOPY W/POUCH 90 (DRAPES) ×3 IMPLANT
DURAPREP 26ML APPLICATOR (WOUND CARE) ×3 IMPLANT
ELECT MENISCUS 165MM 90D (ELECTRODE) ×3 IMPLANT
ELECT REM PT RETURN 9FT ADLT (ELECTROSURGICAL) ×3
ELECTRODE REM PT RTRN 9FT ADLT (ELECTROSURGICAL) ×1 IMPLANT
GAUZE SPONGE 4X4 12PLY STRL (GAUZE/BANDAGES/DRESSINGS) ×3 IMPLANT
GAUZE XEROFORM 1X8 LF (GAUZE/BANDAGES/DRESSINGS) ×3 IMPLANT
GLOVE BIOGEL M STRL SZ7.5 (GLOVE) ×3 IMPLANT
GLOVE BIOGEL PI IND STRL 7.0 (GLOVE) ×1 IMPLANT
GLOVE BIOGEL PI IND STRL 8 (GLOVE) ×1 IMPLANT
GLOVE BIOGEL PI INDICATOR 7.0 (GLOVE) ×2
GLOVE BIOGEL PI INDICATOR 8 (GLOVE) ×2
GLOVE ECLIPSE 7.0 STRL STRAW (GLOVE) ×3 IMPLANT
GLOVE SURG ORTHO 8.0 STRL STRW (GLOVE) ×3 IMPLANT
GOWN STRL REUS W/ TWL LRG LVL3 (GOWN DISPOSABLE) ×1 IMPLANT
GOWN STRL REUS W/ TWL XL LVL3 (GOWN DISPOSABLE) ×2 IMPLANT
GOWN STRL REUS W/TWL LRG LVL3 (GOWN DISPOSABLE) ×2
GOWN STRL REUS W/TWL XL LVL3 (GOWN DISPOSABLE) ×7 IMPLANT
HOLDER KNEE FOAM BLUE (MISCELLANEOUS) ×3 IMPLANT
IV NS IRRIG 3000ML ARTHROMATIC (IV SOLUTION) ×6 IMPLANT
KNEE WRAP E Z 3 GEL PACK (MISCELLANEOUS) ×3 IMPLANT
MANIFOLD NEPTUNE II (INSTRUMENTS) ×3 IMPLANT
PACK ARTHROSCOPY DSU (CUSTOM PROCEDURE TRAY) ×3 IMPLANT
PACK BASIN DAY SURGERY FS (CUSTOM PROCEDURE TRAY) ×3 IMPLANT
PENCIL BUTTON HOLSTER BLD 10FT (ELECTRODE) ×3 IMPLANT
SET ARTHROSCOPY TUBING (MISCELLANEOUS) ×2
SET ARTHROSCOPY TUBING LN (MISCELLANEOUS) ×1 IMPLANT
SUT ETHILON 3 0 PS 1 (SUTURE) ×3 IMPLANT
SUT VIC AB 3-0 FS2 27 (SUTURE) IMPLANT
TOWEL OR 17X24 6PK STRL BLUE (TOWEL DISPOSABLE) ×3 IMPLANT
WATER STERILE IRR 1000ML POUR (IV SOLUTION) ×3 IMPLANT

## 2015-08-11 NOTE — Anesthesia Postprocedure Evaluation (Signed)
Anesthesia Post Note  Patient: Ashley BastLisa K [name]  Procedure(s) Performed: Procedure(s) (LRB): KNEE ARTHROSCOPY WITH EXCISION PLICA (Right) CHONDROPLASTY (Right)  Anesthesia type: general  Patient location: PACU  Post pain: Pain level controlled  Post assessment: Patient's Cardiovascular Status Stable  Last Vitals:  Filed Vitals:   08/11/15 1332  BP:   Pulse: 65  Temp: 36.5 C  Resp: 18    Post vital signs: Reviewed and stable  Level of consciousness: sedated  Complications: No apparent anesthesia complications

## 2015-08-11 NOTE — Interval H&P Note (Signed)
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 After consideration of risks, benefits and other options for treatment, the patient has consented to  Procedure(s): RIGHT KNEE ARTHROSCOPY CHONDROPLASTY PLICA (Right) as a surgical intervention .  The patient's history has been reviewed, patient examined, no change in status, stable for surgery.  I have reviewed the patient's chart and labs.  Questions were answered to the patient's satisfaction.     Loreta Aveaniel F Audrey Thull

## 2015-08-11 NOTE — Discharge Instructions (Signed)
Discharge Instructions after Knee Arthroscopy ° ° °You will have a light dressing on your knee.  °Leave the dressing in place until the third day after your surgery and then remove it and place a band-aid over the stitches.  °After the bandage has been removed you may shower, but do not soak the incision. °You may begin gentle motion of your leg immediately after surgery. °Pump your foot up and down 20 times per hour, every hour you are awake.  °Apply ice to the knee 3 times per day for 30 minutes for the first 1 week until your knee is feeling comfortable again. Do not use heat.  °You may begin straight leg raising exercises (if you have a brace with it on). While lying down, pull your foot all the way up, tighten your quadriceps muscle and lift your heel off of the ground. Hold this position for 2 seconds, and then let the leg back down. Repeat the exercise 10 times, at least 3 times a day.  °Pain medicine has been prescribed for you.  °Use your medicine as needed over the first 48 hours, and then you can begin to taper your use. You may take Extra Strength Tylenol or Tylenol only in place of the pain pills.  ° ° °Please call [phone] for any problems. Including the following: ° °- excessive redness of the incisions °- drainage for more than 4 days °- fever of more than 101.5 F ° °*Please note that pain medications will not be refilled after hours or on weekends. ° ° °Post Anesthesia Home Care Instructions ° °Activity: °Get plenty of rest for the remainder of the day. A responsible adult should stay with you for 24 hours following the procedure.  °For the next 24 hours, DO NOT: °-Drive a car °-Operate machinery °-Drink alcoholic beverages °-Take any medication unless instructed by your physician °-Make any legal decisions or sign important papers. ° °Meals: °Start with liquid foods such as gelatin or soup. Progress to regular foods as tolerated. Avoid greasy, spicy, heavy foods. If nausea and/or vomiting  occur, drink only clear liquids until the nausea and/or vomiting subsides. Call your physician if vomiting continues. ° °Special Instructions/Symptoms: °Your throat may feel dry or sore from the anesthesia or the breathing tube placed in your throat during surgery. If this causes discomfort, gargle with warm salt water. The discomfort should disappear within 24 hours. ° °If you had a scopolamine patch placed behind your ear for the management of post- operative nausea and/or vomiting: ° °1. The medication in the patch is effective for 72 hours, after which it should be removed.  Wrap patch in a tissue and discard in the trash. Wash hands thoroughly with soap and water. °2. You may remove the patch earlier than 72 hours if you experience unpleasant side effects which may include dry mouth, dizziness or visual disturbances. °3. Avoid touching the patch. Wash your hands with soap and water after contact with the patch. ° ° °Call your surgeon if you experience:  ° °1.  Fever over 101.0. °2.  Inability to urinate. °3.  Nausea and/or vomiting. °4.  Extreme swelling or bruising at the surgical site. °5.  Continued bleeding from the incision. °6.  Increased pain, redness or drainage from the incision. °7.  Problems related to your pain medication. °8. Any change in color, movement and/or sensation °9. Any problems and/or concerns ° ° °  ° °

## 2015-08-11 NOTE — Transfer of Care (Signed)
Immediate Anesthesia Transfer of Care Note  Patient: Ashley [name]  Procedure(s) Performed: Procedure(s): KNEE ARTHROSCOPY WITH EXCISION PLICA (Right) CHONDROPLASTY (Right)  Patient Location: PACU  Anesthesia Type:General  Level of Consciousness: awake  Airway & Oxygen Therapy: Patient Spontanous Breathing and Patient connected to face mask oxygen  Post-op Assessment: Report given to RN and Post -op Vital signs reviewed and stable  Post vital signs: Reviewed and stable  Last Vitals:  Filed Vitals:   08/11/15 1102  BP: 120/75  Pulse: 72  Temp: 36.8 C  Resp: 20    Complications: No apparent anesthesia complications

## 2015-08-11 NOTE — Anesthesia Procedure Notes (Signed)
Procedure Name: LMA Insertion Date/Time: 08/11/2015 11:32 AM Performed by: Caren MacadamARTER, Nannie Starzyk W Pre-anesthesia Checklist: Patient identified, Emergency Drugs available, Suction available and Patient being monitored Patient Re-evaluated:Patient Re-evaluated prior to inductionOxygen Delivery Method: Circle System Utilized Preoxygenation: Pre-oxygenation with 100% oxygen Intubation Type: IV induction Ventilation: Mask ventilation without difficulty LMA: LMA inserted LMA Size: 3.0 Number of attempts: 1 Airway Equipment and Method: Bite block Placement Confirmation: positive ETCO2 and breath sounds checked- equal and bilateral Tube secured with: Tape Dental Injury: Teeth and Oropharynx as per pre-operative assessment

## 2015-08-11 NOTE — Anesthesia Preprocedure Evaluation (Signed)

## 2015-08-12 ENCOUNTER — Encounter (HOSPITAL_BASED_OUTPATIENT_CLINIC_OR_DEPARTMENT_OTHER): Payer: Self-pay | Admitting: Orthopedic Surgery

## 2015-08-12 NOTE — Op Note (Signed)
NAMCrist [name]:  [name], Ashley                ACCOUNT NO.:  [id number]  MEDICAL RECORD NO.:  [id number]  LOCATION:                               FACILITY:  [facility]  PHYSICIAN:  Loreta Aveaniel F. Cayle Thunder, M.D. DATE OF BIRTH:  24-Jan-1961  DATE OF PROCEDURE:  08/11/2015 DATE OF DISCHARGE:  08/11/2015                              OPERATIVE REPORT   PREOPERATIVE DIAGNOSES:  Right knee posttraumatic chondromalacia of patella, inflamed medial plica, associated synovitis.  POSTOPERATIVE DIAGNOSES:  Right knee posttraumatic chondromalacia of patella, inflamed medial plica, associated synovitis with focal grade 3 changes peak of patella, grade 2 chondral changes medial femoral condyle, lateral tibial plateau.  PROCEDURE:  Right knee exam under anesthesia, arthroscopy. Chondroplasty of patella and both compartments.  Excision of medial plica, partial synovectomy.  SURGEON:  Loreta Aveaniel F. Quintyn Dombek, M.D.  ASSISTANT:  Mikey KirschnerLindsey Stanberry, PA  ANESTHESIA:  General.  BLOOD LOSS:  Minimal.  SPECIMENS:  None.  CULTURES:  None.  COMPLICATIONS:  None.  DRESSINGS:  Soft compressive.  TOURNIQUET:  Not employed.  DESCRIPTION OF PROCEDURE:  The patient was brought to the operating room, placed on the operating table in a supine position.  After adequate anesthesia had been obtained, leg holder applied.  Leg prepped and draped in usual sterile fashion.  Two portals, one each medial and lateral parapatellar.  Arthroscope introduced, knee distended and inspected.  Good patellar tracking.  Focal grade 3 changes peak of patella.  Chondral loose bodies.  Chondroplasty to a stable surface. Loose bodies removed.  Hypertrophic synovitis, inflamed medial plica. This was all completely excised all the way down to the medial joint line.  I looked to tracking from all angles, which was acceptable without tethering.  Medial meniscus, medial compartment, lateral meniscus, lateral compartment, cruciate ligaments looked good,  except for some focal grade 2 changes on the border of the medial femoral condyle and on the lateral tibial plateau.  Very isolated superficial, debrided to a stable surface.  Entire knee examined.  No other findings were appreciated.  Instruments and fluid were removed.  Portals were closed with nylon.  Knee injected with Depo-Medrol and Marcaine. Sterile compressive dressing applied.  Anesthesia reversed.  Brought to the recovery room.  Tolerated the surgery well.  No complications.     Loreta Aveaniel F. Herschel Fleagle, M.D.     DFM/MEDQ  D:  08/11/2015  T:  08/12/2015  Job:  [id number]

## 2015-09-14 ENCOUNTER — Other Ambulatory Visit (HOSPITAL_COMMUNITY)
Admission: RE | Admit: 2015-09-14 | Discharge: 2015-09-14 | Disposition: A | Discharge status: Home / Self Care | Payer: BLUE CROSS/BLUE SHIELD | Source: Ambulatory Visit | Attending: Pulmonary Disease | Admitting: Pulmonary Disease

## 2015-09-14 DIAGNOSIS — E039 Hypothyroidism, unspecified: Secondary | ICD-10-CM | POA: Diagnosis not present

## 2015-09-14 DIAGNOSIS — R945 Abnormal results of liver function studies: Secondary | ICD-10-CM | POA: Diagnosis present

## 2015-09-14 DIAGNOSIS — M545 Low back pain: Secondary | ICD-10-CM | POA: Diagnosis not present

## 2015-09-14 DIAGNOSIS — K210 Gastro-esophageal reflux disease with esophagitis: Secondary | ICD-10-CM | POA: Diagnosis present

## 2015-09-14 DIAGNOSIS — E785 Hyperlipidemia, unspecified: Secondary | ICD-10-CM | POA: Insufficient documentation

## 2015-09-14 DIAGNOSIS — G4700 Insomnia, unspecified: Secondary | ICD-10-CM | POA: Insufficient documentation

## 2015-09-14 LAB — COMPREHENSIVE METABOLIC PANEL
ALBUMIN: 4.3 g/dL (ref 3.5–5.0)
ALT: 31 U/L (ref 14–54)
AST: 29 U/L (ref 15–41)
Alkaline Phosphatase: 81 U/L (ref 38–126)
Anion gap: 5 (ref 5–15)
BUN: 18 mg/dL (ref 6–20)
CALCIUM: 8.8 mg/dL — AB (ref 8.9–10.3)
CHLORIDE: 103 mmol/L (ref 101–111)
CO2: 28 mmol/L (ref 22–32)
Creatinine, Ser: 0.91 mg/dL (ref 0.44–1.00)
GFR calc Af Amer: >60 mL/min (ref >60)
GLUCOSE: 91 mg/dL (ref 65–99)
Potassium: 4 mmol/L (ref 3.5–5.1)
Sodium: 136 mmol/L (ref 135–145)
TOTAL PROTEIN: 7.7 g/dL (ref 6.5–8.1)
Total Bilirubin: 0.4 mg/dL (ref 0.3–1.2)

## 2015-09-14 LAB — CBC WITH DIFFERENTIAL/PLATELET
Basophils Absolute: 0 10*3/uL (ref 0.0–0.1)
Basophils Relative: 0 %
EOS ABS: 0 10*3/uL (ref 0.0–0.7)
Eosinophils Relative: 1 %
HEMATOCRIT: 38.9 % (ref 36.0–46.0)
HEMOGLOBIN: 13.2 g/dL (ref 12.0–15.0)
LYMPHS ABS: 1.5 10*3/uL (ref 0.7–4.0)
LYMPHS PCT: 19 %
MCH: 32.5 pg (ref 26.0–34.0)
MCHC: 33.9 g/dL (ref 30.0–36.0)
MCV: 95.8 fL (ref 78.0–100.0)
MONOS PCT: 5 %
Monocytes Absolute: 0.4 10*3/uL (ref 0.1–1.0)
NEUTROS PCT: 75 %
Neutro Abs: 5.9 10*3/uL (ref 1.7–7.7)
Platelets: 213 10*3/uL (ref 150–400)
RBC: 4.06 MIL/uL (ref 3.87–5.11)
RDW: 12.8 % (ref 11.5–15.5)
WBC: 7.9 10*3/uL (ref 4.0–10.5)

## 2015-09-14 LAB — LIPID PANEL
CHOL/HDL RATIO: 4.5 ratio
CHOLESTEROL: 245 mg/dL — AB (ref 0–200)
HDL: 54 mg/dL (ref >40)
LDL Cholesterol: 155 mg/dL — ABNORMAL HIGH (ref 0–99)
Triglycerides: 180 mg/dL — ABNORMAL HIGH (ref <150)
VLDL: 36 mg/dL (ref 0–40)

## 2015-09-14 LAB — TSH: TSH: 2.755 u[IU]/mL (ref 0.350–4.500)

## 2016-01-05 ENCOUNTER — Telehealth: Payer: Self-pay | Admitting: Adult Health

## 2016-01-05 NOTE — Telephone Encounter (Signed)
Spoke with pt. Pt is needing samples or [facility] Pt gets [facility] and it hasn't come yet. Ok to give samples per JAG. I gave 10 sample boxes. Lot #[id number] exp 03/2016. Pt to pick up at front desk. JSY

## 2016-03-12 ENCOUNTER — Other Ambulatory Visit: Payer: Self-pay | Admitting: Adult Health

## 2016-05-01 ENCOUNTER — Encounter: Payer: Self-pay | Admitting: Adult Health

## 2016-05-01 ENCOUNTER — Ambulatory Visit (INDEPENDENT_AMBULATORY_CARE_PROVIDER_SITE_OTHER): Payer: BLUE CROSS/BLUE SHIELD | Admitting: Adult Health

## 2016-05-01 VITALS — BP 100/60 | HR 72 | Temp 98.4°F | Ht 71.0 in | Wt 198.3 lb

## 2016-05-01 DIAGNOSIS — Z1389 Encounter for screening for other disorder: Secondary | ICD-10-CM | POA: Diagnosis not present

## 2016-05-01 DIAGNOSIS — N898 Other specified noninflammatory disorders of vagina: Secondary | ICD-10-CM

## 2016-05-01 DIAGNOSIS — N949 Unspecified condition associated with female genital organs and menstrual cycle: Secondary | ICD-10-CM

## 2016-05-01 DIAGNOSIS — N9489 Other specified conditions associated with female genital organs and menstrual cycle: Secondary | ICD-10-CM

## 2016-05-01 LAB — POCT WET PREP (WET MOUNT)
Clue Cells Wet Prep Whiff POC: NEGATIVE
WBC WET PREP: POSITIVE

## 2016-05-01 LAB — POCT URINALYSIS DIPSTICK
Glucose, UA: NEGATIVE
NITRITE UA: NEGATIVE
PROTEIN UA: NEGATIVE

## 2016-05-01 MED ORDER — FLUCONAZOLE 150 MG PO TABS: 150.0000 mg | ORAL_TABLET | Freq: Once | ORAL | 1 refills | Status: AC

## 2016-05-01 MED ORDER — ESTRADIOL 0.1 MG/GM VA CREA: 1.0000 | TOPICAL_CREAM | Freq: Every day | VAGINAL | 0 refills | Status: DC

## 2016-05-01 NOTE — Progress Notes (Signed)
Subjective:     Patient ID: Ashley [name], female   DOB: 1961-03-14, 55 y.o.   MRN: [id number]  HPI Ashley [name] is a 55 year old white female, worked in for burning in vagina, ?UTI.It started today and she felt like this before with vaginal dryness before HRT.  Review of Systems Vaginal burning Reviewed past medical,surgical, social and family history. Reviewed medications and allergies.     Objective:   Physical Exam BP 100/60   Pulse 72   Temp 98.4 F (36.9 C)   Ht 5\' 11"  (1.803 m)   Wt 198 lb 4.8 oz (89.9 kg)   BMI 27.66 kg/m urine was negative at first then showed trace blood and 2+ leuks, Skin warm and dry.Pelvic: external genitalia is normal in appearance no lesions, vagina: scant discharge without odor,urethra has no lesions or masses noted, cervix:smooth and bulbous, uterus: normal size, shape and contour, non tender, no masses felt, adnexa: no masses or tenderness noted. Bladder is non tender and no masses felt. Wet prep: +WBCs and parabasal cells    Assessment:     Vaginal irritation  Vaginal burning     Plan:     Given 36 gm estrace vaginal cream to use 1 gm in vagina daily for several days  Rx diflucan 150 mg #1 take 1 now with 1 refill  UA C&S sent Follow up prn

## 2016-05-01 NOTE — Patient Instructions (Signed)
Use 1 gm in vagina daily  Take diflucan Follow up prn

## 2016-05-03 LAB — URINALYSIS, ROUTINE W REFLEX MICROSCOPIC
Bilirubin, UA: NEGATIVE
Glucose, UA: NEGATIVE
KETONES UA: NEGATIVE
NITRITE UA: POSITIVE — AB
Protein, UA: NEGATIVE
RBC UA: NEGATIVE
SPEC GRAV UA: 1.026 (ref 1.005–1.030)
Urobilinogen, Ur: 0.2 mg/dL (ref 0.2–1.0)
pH, UA: 6 (ref 5.0–7.5)

## 2016-05-03 LAB — MICROSCOPIC EXAMINATION: Casts: NONE SEEN /lpf

## 2016-05-04 ENCOUNTER — Telehealth: Payer: Self-pay | Admitting: Adult Health

## 2016-05-04 LAB — URINE CULTURE

## 2016-05-04 MED ORDER — AMPICILLIN 500 MG PO CAPS: 500.0000 mg | ORAL_CAPSULE | Freq: Four times a day (QID) | ORAL | 0 refills | Status: DC

## 2016-05-04 NOTE — Telephone Encounter (Signed)
Pt aware + E coli, in urine Rx ampicillin 500 qid x 7 days

## 2016-05-23 ENCOUNTER — Telehealth: Payer: Self-pay | Admitting: Adult Health

## 2016-05-23 MED ORDER — AMOXICILLIN-POT CLAVULANATE 875-125 MG PO TABS: 1.0000 | ORAL_TABLET | Freq: Two times a day (BID) | ORAL | 0 refills | Status: DC

## 2016-05-23 NOTE — Telephone Encounter (Signed)
Spoke with pt. Pt was put on Ampicillin for UTI. She finished med but really don't feel like symptoms are all the way gone. She noticed back pain and urinary frequency last pm. Can you prescribe something stronger? Thanks!! JSY

## 2016-05-23 NOTE — Telephone Encounter (Signed)
Called in augmentin

## 2016-05-23 NOTE — Telephone Encounter (Signed)
Pt called stating that Ashley [name] has prescribed her a medication for her kidney infection, pt states that this medication didn't work because she still has the infection. Pt would like to know if Ashley [name] could prescribe her something a little stronger. Please contact pt

## 2016-06-13 ENCOUNTER — Other Ambulatory Visit: Payer: BLUE CROSS/BLUE SHIELD

## 2016-06-13 DIAGNOSIS — R3915 Urgency of urination: Secondary | ICD-10-CM

## 2016-06-14 LAB — URINALYSIS, ROUTINE W REFLEX MICROSCOPIC
BILIRUBIN UA: NEGATIVE
GLUCOSE, UA: NEGATIVE
Ketones, UA: NEGATIVE
LEUKOCYTES UA: NEGATIVE
Nitrite, UA: NEGATIVE
PROTEIN UA: NEGATIVE
RBC, UA: NEGATIVE
Specific Gravity, UA: 1.013 (ref 1.005–1.030)
UUROB: 0.2 mg/dL (ref 0.2–1.0)
pH, UA: 6 (ref 5.0–7.5)

## 2016-06-15 ENCOUNTER — Telehealth: Payer: Self-pay | Admitting: Adult Health

## 2016-06-15 LAB — URINE CULTURE

## 2016-06-15 NOTE — Telephone Encounter (Signed)
Spoke with pt letting her know she does not have a UTI. Pt voiced understanding. JSY

## 2016-10-04 DIAGNOSIS — S335XXA Sprain of ligaments of lumbar spine, initial encounter: Secondary | ICD-10-CM | POA: Diagnosis not present

## 2016-10-10 ENCOUNTER — Telehealth (HOSPITAL_COMMUNITY): Payer: Self-pay | Admitting: Physical Therapy

## 2016-10-10 NOTE — Telephone Encounter (Signed)
Called pt to schedule -[facility] earlier than 10/18/16. Requested we hold order until she gets in. oder is in hold drawer.

## 2016-10-11 DIAGNOSIS — M5416 Radiculopathy, lumbar region: Secondary | ICD-10-CM | POA: Diagnosis not present

## 2016-10-11 DIAGNOSIS — M545 Low back pain: Secondary | ICD-10-CM | POA: Diagnosis not present

## 2016-10-11 DIAGNOSIS — M79605 Pain in left leg: Secondary | ICD-10-CM | POA: Diagnosis not present

## 2016-10-11 DIAGNOSIS — M5136 Other intervertebral disc degeneration, lumbar region: Secondary | ICD-10-CM | POA: Diagnosis not present

## 2016-10-12 DIAGNOSIS — M79605 Pain in left leg: Secondary | ICD-10-CM | POA: Diagnosis not present

## 2016-10-12 DIAGNOSIS — M545 Low back pain: Secondary | ICD-10-CM | POA: Diagnosis not present

## 2016-10-12 DIAGNOSIS — M5416 Radiculopathy, lumbar region: Secondary | ICD-10-CM | POA: Diagnosis not present

## 2016-10-12 DIAGNOSIS — M5136 Other intervertebral disc degeneration, lumbar region: Secondary | ICD-10-CM | POA: Diagnosis not present

## 2016-10-15 DIAGNOSIS — M545 Low back pain: Secondary | ICD-10-CM | POA: Diagnosis not present

## 2016-10-15 DIAGNOSIS — M5416 Radiculopathy, lumbar region: Secondary | ICD-10-CM | POA: Diagnosis not present

## 2016-10-15 DIAGNOSIS — M5136 Other intervertebral disc degeneration, lumbar region: Secondary | ICD-10-CM | POA: Diagnosis not present

## 2016-10-15 DIAGNOSIS — M79605 Pain in left leg: Secondary | ICD-10-CM | POA: Diagnosis not present

## 2016-10-16 DIAGNOSIS — M5136 Other intervertebral disc degeneration, lumbar region: Secondary | ICD-10-CM | POA: Diagnosis not present

## 2016-10-22 ENCOUNTER — Other Ambulatory Visit: Payer: Self-pay | Admitting: Sports Medicine

## 2016-10-22 DIAGNOSIS — M545 Low back pain: Principal | ICD-10-CM

## 2016-10-22 DIAGNOSIS — G8929 Other chronic pain: Secondary | ICD-10-CM

## 2016-10-25 DIAGNOSIS — M5126 Other intervertebral disc displacement, lumbar region: Secondary | ICD-10-CM | POA: Diagnosis not present

## 2016-10-26 ENCOUNTER — Other Ambulatory Visit: Payer: Self-pay | Admitting: Neurosurgery

## 2016-10-26 DIAGNOSIS — M5126 Other intervertebral disc displacement, lumbar region: Secondary | ICD-10-CM

## 2016-10-29 ENCOUNTER — Ambulatory Visit
Admission: RE | Admit: 2016-10-29 | Discharge: 2016-10-29 | Disposition: A | Discharge status: Home / Self Care | Payer: BLUE CROSS/BLUE SHIELD | Source: Ambulatory Visit | Attending: Neurosurgery | Admitting: Neurosurgery

## 2016-10-29 DIAGNOSIS — M5126 Other intervertebral disc displacement, lumbar region: Secondary | ICD-10-CM

## 2016-10-29 MED ORDER — IOPAMIDOL (ISOVUE-M 200) INJECTION 41%
1.0000 mL | Freq: Once | INTRAMUSCULAR | Status: AC
  Administered 2016-10-29: 1 mL via EPIDURAL

## 2016-10-29 MED ORDER — METHYLPREDNISOLONE ACETATE 40 MG/ML INJ SUSP (RADIOLOG
120.0000 mg | Freq: Once | INTRAMUSCULAR | Status: AC
  Administered 2016-10-29: 120 mg via EPIDURAL

## 2016-10-29 NOTE — Discharge Instructions (Signed)

## 2016-11-05 ENCOUNTER — Other Ambulatory Visit (HOSPITAL_COMMUNITY)
Admission: RE | Admit: 2016-11-05 | Discharge: 2016-11-05 | Disposition: A | Discharge status: Home / Self Care | Payer: BLUE CROSS/BLUE SHIELD | Source: Ambulatory Visit | Attending: Pulmonary Disease | Admitting: Pulmonary Disease

## 2016-11-05 DIAGNOSIS — E039 Hypothyroidism, unspecified: Secondary | ICD-10-CM | POA: Insufficient documentation

## 2016-11-05 DIAGNOSIS — G4700 Insomnia, unspecified: Secondary | ICD-10-CM | POA: Diagnosis not present

## 2016-11-05 DIAGNOSIS — K210 Gastro-esophageal reflux disease with esophagitis: Secondary | ICD-10-CM | POA: Diagnosis not present

## 2016-11-05 DIAGNOSIS — Z0000 Encounter for general adult medical examination without abnormal findings: Secondary | ICD-10-CM | POA: Insufficient documentation

## 2016-11-05 DIAGNOSIS — E785 Hyperlipidemia, unspecified: Secondary | ICD-10-CM | POA: Insufficient documentation

## 2016-11-05 DIAGNOSIS — M545 Low back pain: Secondary | ICD-10-CM | POA: Insufficient documentation

## 2016-11-05 LAB — LIPID PANEL
CHOLESTEROL: 242 mg/dL — AB (ref 0–200)
HDL: 62 mg/dL (ref >40)
LDL Cholesterol: 146 mg/dL — ABNORMAL HIGH (ref 0–99)
TRIGLYCERIDES: 170 mg/dL — AB (ref <150)
Total CHOL/HDL Ratio: 3.9 RATIO
VLDL: 34 mg/dL (ref 0–40)

## 2016-11-05 LAB — CBC
HCT: 40.1 % (ref 36.0–46.0)
Hemoglobin: 13.7 g/dL (ref 12.0–15.0)
MCH: 32 pg (ref 26.0–34.0)
MCHC: 34.2 g/dL (ref 30.0–36.0)
MCV: 93.7 fL (ref 78.0–100.0)
Platelets: 292 10*3/uL (ref 150–400)
RBC: 4.28 MIL/uL (ref 3.87–5.11)
RDW: 12.6 % (ref 11.5–15.5)
WBC: 8.7 10*3/uL (ref 4.0–10.5)

## 2016-11-05 LAB — COMPREHENSIVE METABOLIC PANEL
ALBUMIN: 4 g/dL (ref 3.5–5.0)
ALK PHOS: 61 U/L (ref 38–126)
ALT: 26 U/L (ref 14–54)
AST: 18 U/L (ref 15–41)
Anion gap: 9 (ref 5–15)
BILIRUBIN TOTAL: 0.6 mg/dL (ref 0.3–1.2)
BUN: 23 mg/dL — AB (ref 6–20)
CALCIUM: 9.4 mg/dL (ref 8.9–10.3)
CO2: 23 mmol/L (ref 22–32)
Chloride: 104 mmol/L (ref 101–111)
Creatinine, Ser: 0.9 mg/dL (ref 0.44–1.00)
GFR calc Af Amer: >60 mL/min (ref >60)
GFR calc non Af Amer: >60 mL/min (ref >60)
GLUCOSE: 91 mg/dL (ref 65–99)
POTASSIUM: 3.6 mmol/L (ref 3.5–5.1)
Sodium: 136 mmol/L (ref 135–145)
TOTAL PROTEIN: 7.5 g/dL (ref 6.5–8.1)

## 2016-11-05 LAB — TSH: TSH: 4.713 u[IU]/mL — ABNORMAL HIGH (ref 0.350–4.500)

## 2016-11-07 ENCOUNTER — Other Ambulatory Visit: Payer: Self-pay | Admitting: Neurosurgery

## 2016-11-07 DIAGNOSIS — M5126 Other intervertebral disc displacement, lumbar region: Secondary | ICD-10-CM

## 2016-11-12 ENCOUNTER — Ambulatory Visit
Admission: RE | Admit: 2016-11-12 | Discharge: 2016-11-12 | Disposition: A | Discharge status: Home / Self Care | Payer: BLUE CROSS/BLUE SHIELD | Source: Ambulatory Visit | Attending: Neurosurgery | Admitting: Neurosurgery

## 2016-11-12 ENCOUNTER — Other Ambulatory Visit: Payer: Self-pay | Admitting: Neurosurgery

## 2016-11-12 DIAGNOSIS — M545 Low back pain: Secondary | ICD-10-CM | POA: Diagnosis not present

## 2016-11-12 DIAGNOSIS — M5126 Other intervertebral disc displacement, lumbar region: Secondary | ICD-10-CM

## 2016-11-12 MED ORDER — IOPAMIDOL (ISOVUE-M 200) INJECTION 41%
1.0000 mL | Freq: Once | INTRAMUSCULAR | Status: AC
  Administered 2016-11-12: 1 mL via EPIDURAL

## 2016-11-12 MED ORDER — METHYLPREDNISOLONE ACETATE 40 MG/ML INJ SUSP (RADIOLOG
120.0000 mg | Freq: Once | INTRAMUSCULAR | Status: AC
Start: 2016-11-12 — End: 2016-11-12
  Administered 2016-11-12: 120 mg via EPIDURAL

## 2016-11-12 NOTE — Discharge Instructions (Signed)

## 2016-11-16 ENCOUNTER — Ambulatory Visit (INDEPENDENT_AMBULATORY_CARE_PROVIDER_SITE_OTHER): Payer: BLUE CROSS/BLUE SHIELD | Admitting: Adult Health

## 2016-11-16 ENCOUNTER — Encounter: Payer: Self-pay | Admitting: Adult Health

## 2016-11-16 VITALS — BP 110/68 | HR 70 | Temp 98.3°F | Ht 71.5 in | Wt 198.0 lb

## 2016-11-16 DIAGNOSIS — R110 Nausea: Secondary | ICD-10-CM | POA: Diagnosis not present

## 2016-11-16 DIAGNOSIS — K219 Gastro-esophageal reflux disease without esophagitis: Secondary | ICD-10-CM

## 2016-11-16 DIAGNOSIS — N951 Menopausal and female climacteric states: Secondary | ICD-10-CM

## 2016-11-16 MED ORDER — PANTOPRAZOLE SODIUM 40 MG PO TBEC: 40.0000 mg | DELAYED_RELEASE_TABLET | Freq: Every day | ORAL | 6 refills | Status: DC

## 2016-11-16 MED ORDER — ONDANSETRON HCL 4 MG PO TABS: 4.0000 mg | ORAL_TABLET | Freq: Three times a day (TID) | ORAL | 2 refills | Status: DC | PRN

## 2016-11-16 MED ORDER — ESTRADIOL 0.1 MG/GM VA CREA: 1.0000 | TOPICAL_CREAM | VAGINAL | 1 refills | Status: DC

## 2016-11-16 NOTE — Progress Notes (Signed)
Subjective:     Patient ID: Ashley BastLisa K [name], female   DOB: 02/15/1961, 56 y.o.   MRN: [id number]  HPI Ashley StanleyLisa is a 56 year old white female in complaining of vaginal dryness and nausea.Has ruptured disc L4-5 and sees Dr Channing Muttersoy and has had 2 injections so far and using crutch.Has had chills and hot flashes at times too.no trouble voiding or having BM.  Review of Systems Vaginal dryness  Nausea Chills and hot flashes No trouble voiding or with BMs. Reviewed past medical,surgical, social and family history. Reviewed medications and allergies.     Objective:   Physical Exam BP 110/68 (BP Location: Left Arm, Patient Position: Sitting, Cuff Size: Normal)   Pulse 70   Temp 98.3 F (36.8 C)   Ht 5' 11.5" (1.816 m)   Wt 198 lb (89.8 kg)   BMI 27.23 kg/m PHQ 2 score 0.Skin warm and dry.Pelvic: external genitalia is normal in appearance no lesions, vagina: pale and loss of moisture and rugae,urethra has no lesions or masses noted, cervix:smooth and bulbous, uterus: normal size, shape and contour, non tender, no masses felt, adnexa: no masses or tenderness noted. Bladder is non tender and no masses felt.   Abdomen is soft and non tender.  Has Nexium for reflux but does not take right, will change meds to see if helps.  Assessment:     1. Vaginal dryness, menopausal   2. Nausea   3. Gastroesophageal reflux disease, esophagitis presence not specified       Plan:     Rx zofran 4 mg #20 take 1 every 8 hours prn nausea and vomiting, with 2 refills Rx estrace vaginal cream at least 1- 2 x weekly Rx protonix 40 mg #30 take 1 daily with 6 refills Follow up prn

## 2016-11-19 ENCOUNTER — Other Ambulatory Visit: Payer: BLUE CROSS/BLUE SHIELD

## 2016-11-19 ENCOUNTER — Telehealth: Payer: Self-pay | Admitting: Adult Health

## 2016-11-19 DIAGNOSIS — Z87440 Personal history of urinary (tract) infections: Secondary | ICD-10-CM | POA: Diagnosis not present

## 2016-11-19 NOTE — Telephone Encounter (Signed)
Pt has zofran at home and is on protonix since the weekend

## 2016-11-19 NOTE — Telephone Encounter (Signed)
[facility]. Please contact pt

## 2016-11-20 ENCOUNTER — Telehealth: Payer: Self-pay | Admitting: Adult Health

## 2016-11-20 LAB — URINALYSIS, ROUTINE W REFLEX MICROSCOPIC
Bilirubin, UA: NEGATIVE
GLUCOSE, UA: NEGATIVE
Ketones, UA: NEGATIVE
NITRITE UA: POSITIVE — AB
Protein, UA: NEGATIVE
Specific Gravity, UA: 1.012 (ref 1.005–1.030)
UUROB: 0.2 mg/dL (ref 0.2–1.0)
pH, UA: 5.5 (ref 5.0–7.5)

## 2016-11-20 LAB — MICROSCOPIC EXAMINATION

## 2016-11-20 MED ORDER — CIPROFLOXACIN HCL 500 MG PO TABS: 500.0000 mg | ORAL_TABLET | Freq: Two times a day (BID) | ORAL | 0 refills | Status: DC

## 2016-11-20 NOTE — Telephone Encounter (Signed)
Urine +nitrates will rx cipro.  PT aware

## 2016-11-21 ENCOUNTER — Other Ambulatory Visit: Payer: Self-pay | Admitting: Neurosurgery

## 2016-11-21 ENCOUNTER — Telehealth: Payer: Self-pay | Admitting: Adult Health

## 2016-11-21 DIAGNOSIS — M5126 Other intervertebral disc displacement, lumbar region: Secondary | ICD-10-CM

## 2016-11-21 LAB — URINE CULTURE

## 2016-11-21 NOTE — Telephone Encounter (Signed)
Pt aware that urine + E coli, was treated with Cipro.

## 2016-11-26 DIAGNOSIS — M5126 Other intervertebral disc displacement, lumbar region: Secondary | ICD-10-CM | POA: Diagnosis not present

## 2016-11-26 DIAGNOSIS — Z0000 Encounter for general adult medical examination without abnormal findings: Secondary | ICD-10-CM | POA: Diagnosis not present

## 2016-11-27 ENCOUNTER — Ambulatory Visit
Admission: RE | Admit: 2016-11-27 | Discharge: 2016-11-27 | Disposition: A | Discharge status: Home / Self Care | Payer: BLUE CROSS/BLUE SHIELD | Source: Ambulatory Visit | Attending: Neurosurgery | Admitting: Neurosurgery

## 2016-11-27 DIAGNOSIS — M5416 Radiculopathy, lumbar region: Secondary | ICD-10-CM | POA: Diagnosis not present

## 2016-11-27 DIAGNOSIS — M5126 Other intervertebral disc displacement, lumbar region: Secondary | ICD-10-CM

## 2016-11-27 MED ORDER — IOPAMIDOL (ISOVUE-M 200) INJECTION 41%
1.0000 mL | Freq: Once | INTRAMUSCULAR | Status: AC
  Administered 2016-11-27: 1 mL via EPIDURAL

## 2016-11-27 MED ORDER — METHYLPREDNISOLONE ACETATE 40 MG/ML INJ SUSP (RADIOLOG
120.0000 mg | Freq: Once | INTRAMUSCULAR | Status: AC
  Administered 2016-11-27: 120 mg via EPIDURAL

## 2016-11-27 NOTE — Discharge Instructions (Signed)
Post Procedure Spinal Discharge Instruction Sheet  1. You may resume a regular diet and any medications that you routinely take (including pain medications).  2. No driving day of procedure.  3. Light activity throughout the rest of the day.  Do not do any strenuous work, exercise, bending or lifting.  The day following the procedure, you can resume normal physical activity but you should refrain from exercising or physical therapy for at least three days thereafter.   Common Side Effects:   Headaches- take your usual medications as directed by your physician.  Increase your fluid intake.  Caffeinated beverages may be helpful.  Lie flat in bed until your headache resolves.   Restlessness or inability to sleep- you may have trouble sleeping for the next few days.  Ask your referring physician if you need any medication for sleep.   Facial flushing or redness- should subside within a few days.   Increased pain- a temporary increase in pain a day or two following your procedure is not unusual.  Take your pain medication as prescribed by your referring physician.   Leg cramps  Please contact our office at [phone] for the following symptoms:  Fever greater than 100 degrees.  Headaches unresolved with medication after 2-3 days.  Increased swelling, pain, or redness at injection site.  Thank you for visiting our office.  If you need to schedule or reschedule the number is [phone].

## 2016-11-29 ENCOUNTER — Emergency Department (HOSPITAL_COMMUNITY): Payer: BLUE CROSS/BLUE SHIELD

## 2016-11-29 ENCOUNTER — Encounter (HOSPITAL_COMMUNITY): Payer: Self-pay | Admitting: Emergency Medicine

## 2016-11-29 ENCOUNTER — Emergency Department (HOSPITAL_COMMUNITY)
Admission: EM | Admit: 2016-11-29 | Discharge: 2016-11-30 | Disposition: A | Discharge status: Home / Self Care | Payer: BLUE CROSS/BLUE SHIELD | Attending: Emergency Medicine | Admitting: Emergency Medicine

## 2016-11-29 DIAGNOSIS — R0789 Other chest pain: Secondary | ICD-10-CM | POA: Diagnosis not present

## 2016-11-29 DIAGNOSIS — R079 Chest pain, unspecified: Secondary | ICD-10-CM | POA: Diagnosis not present

## 2016-11-29 DIAGNOSIS — E039 Hypothyroidism, unspecified: Secondary | ICD-10-CM | POA: Insufficient documentation

## 2016-11-29 DIAGNOSIS — Z79899 Other long term (current) drug therapy: Secondary | ICD-10-CM | POA: Insufficient documentation

## 2016-11-29 LAB — BASIC METABOLIC PANEL
Anion gap: 9 (ref 5–15)
BUN: 24 mg/dL — AB (ref 6–20)
CHLORIDE: 101 mmol/L (ref 101–111)
CO2: 27 mmol/L (ref 22–32)
CREATININE: 0.85 mg/dL (ref 0.44–1.00)
Calcium: 9 mg/dL (ref 8.9–10.3)
GFR calc Af Amer: >60 mL/min (ref >60)
GFR calc non Af Amer: >60 mL/min (ref >60)
Glucose, Bld: 106 mg/dL — ABNORMAL HIGH (ref 65–99)
POTASSIUM: 4 mmol/L (ref 3.5–5.1)
Sodium: 137 mmol/L (ref 135–145)

## 2016-11-29 LAB — CBC
HEMATOCRIT: 35.7 % — AB (ref 36.0–46.0)
HEMOGLOBIN: 12.1 g/dL (ref 12.0–15.0)
MCH: 32 pg (ref 26.0–34.0)
MCHC: 33.9 g/dL (ref 30.0–36.0)
MCV: 94.4 fL (ref 78.0–100.0)
Platelets: 183 10*3/uL (ref 150–400)
RBC: 3.78 MIL/uL — ABNORMAL LOW (ref 3.87–5.11)
RDW: 13.2 % (ref 11.5–15.5)
WBC: 8.2 10*3/uL (ref 4.0–10.5)

## 2016-11-29 LAB — TROPONIN I: Troponin I: <0.03 ng/mL (ref <0.03)

## 2016-11-29 MED ORDER — ASPIRIN 81 MG PO CHEW
324.0000 mg | CHEWABLE_TABLET | Freq: Once | ORAL | Status: AC
  Administered 2016-11-29: 324 mg via ORAL
  Filled 2016-11-29: qty 4

## 2016-11-29 NOTE — Discharge Instructions (Addendum)
Talk to the doctor who prescribed your protonix. They may want you to take it twice a day for the next couple of weeks, then back down to once a day. You can also take TUMS for episodes between doses of protonix. Look at the GERD information sheet to see if there are things you can do to make it better. Recheck if you get worse.

## 2016-11-29 NOTE — ED Provider Notes (Signed)
[facility] Provider Note   CSN: [id number] Arrival date & time: 11/29/16  2109     History   Chief Complaint Chief Complaint  Patient presents with  . Chest Pain    HPI Ashley [name] is a 56 y.o. female.  HPI  56 year old female presents with chest pain and a feeling of needing to burp. She states it started around 8:30 PM. The most significant amount of pain and uncomfortableness lasted around 15 minutes. She has a long-standing history of GERD and used to be on Nexium but a couple weeks ago was switched over to [employer]. However she states she also doesn't think she took the Nexium as prescribed. She has had more reflux since switching to Protonix. Tonight she had a feeling like she needed to burp and I she did burp a couple times. She took antacids, Tums, but did not express relief is facet she thought. However 10 minutes later the pain and discomfort was significantly better. She now has very mild chest burning. Denies ever having diaphoresis, shortness of breath, nausea, or vomiting. She has no known history of hypertension, has mild hyperlipidemia, no history of smoking and family history of coronary disease.  Past Medical History:  Diagnosis Date  . Chondromalacia of right patella 08/2015  . Dental crown present   . Family history of adverse reaction to anesthesia    pt's daughter has hx. of post-op N/V  . Heart murmur    states no known problems  . Herniated disc, cervical   . History of hepatitis during college   associated with mono  . Hyperlipidemia   . Hypothyroid 06/16/2015  . Plica syndrome of right knee 08/2015  . Shingles rash 08/05/2015   possible - will start Valtrex today  . Tachycardia    no cardiologist    Patient Active Problem List   Diagnosis Date Noted  . Special screening for malignant neoplasms, colon   . Hypothyroid 06/16/2015  . Vaginal irritation 11/04/2014  . Hematuria 11/04/2014  . Hot flashes 11/04/2014  . Hormone replacement  therapy (HRT) 11/04/2014  . PMB (postmenopausal bleeding) 02/15/2014  . RUPTURE ROTATOR CUFF 03/09/2009  . Pain in joint, shoulder region 06/23/2008  . IMPINGEMENT SYNDROME 06/23/2008    Past Surgical History:  Procedure Laterality Date  . APPENDECTOMY  1966  . CHOLECYSTECTOMY  04/18/2007  . CHONDROPLASTY Right 08/11/2015   Procedure: CHONDROPLASTY;  Surgeon: Loreta Ave, MD;  Location: [facility];  Service: Orthopedics;  Laterality: Right;  . COLONOSCOPY N/A 07/15/2015   Procedure: COLONOSCOPY;  Surgeon: West Bali, MD;  Location[facility];  Service: Endoscopy;  Laterality: N/A;  10:45 AM  . ENDOMETRIAL ABLATION  03/14/2011  . HYSTEROSCOPY W/D&C  03/14/2011  . KNEE ARTHROSCOPY WITH EXCISION PLICA Right 08/11/2015   Procedure: KNEE ARTHROSCOPY WITH EXCISION PLICA;  Surgeon: Loreta Ave, MD;  Location: [facility];  Service: Orthopedics;  Laterality: Right;  . SHOULDER ARTHROSCOPY Right     OB History    Gravida Para Term Preterm AB Living   7 6     1 6    SAB TAB Ectopic Multiple Live Births   1               Home Medications    Prior to Admission medications   Medication Sig Start Date End Date Taking? Authorizing Provider  Ascorbic Acid (VITAMIN C PO) Take by mouth as needed.    Historical Provider, MD  atenolol (TENORMIN)  50 MG tablet 50 mg daily.  02/01/14   Historical Provider, MD  calcium carbonate (OS-CAL) 600 MG TABS tablet Take 600 mg by mouth daily.     Historical Provider, MD  ciprofloxacin (CIPRO) 500 MG tablet Take 1 tablet (500 mg total) by mouth 2 (two) times daily. 11/20/16   Adline Potter, NP  Cyanocobalamin (VITAMIN B 12 PO) Take by mouth daily.    Historical Provider, MD  DUAVEE 0.45-20 MG TABS TAKE 1 TABLET DAILY 03/12/16   Adline Potter, NP  estradiol (ESTRACE VAGINAL) 0.1 MG/GM vaginal cream Place 1 Applicatorful vaginally 3 (three) times a week. 11/16/16   Adline Potter, NP  loratadine (CLARITIN) 10  MG tablet Take 10 mg by mouth daily.    Historical Provider, MD  Multiple Vitamin (MULTIVITAMIN) tablet Take 1 tablet by mouth daily.    Historical Provider, MD  ondansetron (ZOFRAN) 4 MG tablet Take 1 tablet (4 mg total) by mouth every 8 (eight) hours as needed for nausea or vomiting. 11/16/16   Adline Potter, NP  pantoprazole (PROTONIX) 40 MG tablet Take 1 tablet (40 mg total) by mouth daily. 11/16/16   Adline Potter, NP  SYNTHROID 100 MCG tablet 100 mcg daily.  02/01/14   Historical Provider, MD  Zolpidem Tartrate (AMBIEN PO) Take by mouth as needed.    Historical Provider, MD    Family History Family History  Problem Relation Age of Onset  . Hypertension Mother   . Heart disease Mother     pacemaker  . Dementia Mother   . Hypertension Father   . Parkinson's disease Father   . Mental illness Sister   . Bipolar disorder Sister   . Cancer Maternal Grandmother     breast,eye  . Heart disease Maternal Grandmother   . Heart attack Maternal Grandfather   . Alcohol abuse Paternal Grandfather   . Anesthesia problems Daughter     post-op N/V  . Other Son     Lyme disease    Social History Social History  Substance Use Topics  . Smoking status: Never Smoker  . Smokeless tobacco: Never Used  . Alcohol use No     Allergies   Sulfa antibiotics and Macrobid [nitrofurantoin monohyd macro]   Review of Systems Review of Systems  Constitutional: Negative for diaphoresis.  Respiratory: Negative for shortness of breath.   Cardiovascular: Positive for chest pain.  Gastrointestinal: Negative for abdominal pain and vomiting.  All other systems reviewed and are negative.    Physical Exam Updated Vital Signs BP 149/86   Pulse 72   Temp 97.7 F (36.5 C)   Resp 18   Ht 5' 11.26" (1.81 m)   Wt 195 lb (88.5 kg)   SpO2 98%   BMI 27.00 kg/m   Physical Exam  Constitutional: She is oriented to person, place, and time. She appears well-developed and well-nourished. No  distress.  HENT:  Head: Normocephalic and atraumatic.  Right Ear: External ear normal.  Left Ear: External ear normal.  Nose: Nose normal.  Eyes: Right eye exhibits no discharge. Left eye exhibits no discharge.  Cardiovascular: Normal rate, regular rhythm and normal heart sounds.   Pulmonary/Chest: Effort normal and breath sounds normal.  Abdominal: Soft. She exhibits no distension. There is no tenderness.  Neurological: She is alert and oriented to person, place, and time.  Skin: Skin is warm and dry. She is not diaphoretic.  Nursing note and vitals reviewed.    ED Treatments / Results  Labs (all labs ordered are listed, but only abnormal results are displayed) Labs Reviewed  BASIC METABOLIC PANEL  TROPONIN I  CBC    EKG  EKG Interpretation None       Radiology No results found.  Procedures Procedures (including critical care time)  Medications Ordered in ED Medications  aspirin chewable tablet 324 mg (not administered)     Initial Impression / Assessment and Plan / ED Course  I have reviewed the triage vital signs and the nursing notes.  Pertinent labs & imaging results that were available during my care of the patient were reviewed by me and considered in my medical decision making (see chart for details).  Clinical Course as of Nov 30 2334  Morey Hummingbirdhu Nov 29, 2016  2128 Patient's presentation is atypical. Her ECG is reassuring. Will get labs, chest x-ray, and likely a second troponin. She does not want any treatment at this time.  [SG]  2227 Initial workup reassuring. Labs are benign. Patient is feeling well. Plan for second troponin.  [SG]    Clinical Course User Index [SG] Ashley [name] Shulem Mader, MD    Care to Dr. Lynelle DoctorKnapp with 2nd troponin upcoming. If negative, d/c home.  Final Clinical Impressions(s) / ED Diagnoses   Final diagnoses:  Atypical chest pain    New Prescriptions New Prescriptions   No medications on file     Ashley [name] Ife Vitelli, MD 11/29/16  2337

## 2016-11-29 NOTE — ED Triage Notes (Signed)
Pt c/o sudden severe chest pain that started in neck and radiates to chest. Pt states her acid reflux meds were recently changed.

## 2016-11-30 ENCOUNTER — Other Ambulatory Visit: Payer: Self-pay | Admitting: Obstetrics & Gynecology

## 2016-11-30 ENCOUNTER — Telehealth: Payer: Self-pay | Admitting: Adult Health

## 2016-11-30 LAB — TROPONIN I: Troponin I: <0.03 ng/mL (ref <0.03)

## 2016-11-30 MED ORDER — DEXLANSOPRAZOLE 60 MG PO CPDR: 60.0000 mg | DELAYED_RELEASE_CAPSULE | Freq: Every day | ORAL | 11 refills | Status: DC

## 2016-11-30 NOTE — ED Notes (Signed)
Pt alert & oriented x4, stable gait. Patient given discharge instructions, paperwork & prescription(s). Patient  instructed to stop at the registration desk to finish any additional paperwork. Patient verbalized understanding. Pt left department w/ no further questions. 

## 2016-11-30 NOTE — Telephone Encounter (Signed)
Patient called stating that she went to the ER the other day because of her reflux, pt states that she is not sure if the medication [facility] placed her on is doing the Job. Er doctor told patient for her to take two a day until she feels relief. Patient was to know if that is okay, Please contact pt

## 2016-11-30 NOTE — ED Provider Notes (Signed)
Pt left at change of shift to get second EKG and troponin. She states she was on Nexium twice a day for years. However when she was switched to Protonix it was just once a day. We discussed her test results. She was given information just to make sure there are things she can do to make the symptoms better such as foods to avoid. She can contact her provider and they may want her to increase the Protonix also to twice a day.   Results for orders placed or performed during the hospital encounter of 11/29/16  Troponin I  Result Value Ref Range   Troponin I <0.03 <0.03 ng/mL  Troponin I  Result Value Ref Range   Troponin I <0.03 <0.03 ng/mL    #2  EKG Interpretation  Date/Time:  Thursday November 29 2016 23:40:05 EST Ventricular Rate:  59 PR Interval:    QRS Duration: 94 QT Interval:  421 QTC Calculation: 417 R Axis:   86 Text Interpretation:  Sinus rhythm Borderline Right axis deviation No significant change since EARLIER SAME DATE Confirmed by Jenyfer Trawick  MD-I, Thanos Cousineau ([id number]) on 11/30/2016 1:15:04 AM       Diagnoses that have been ruled out:  None  Diagnoses that are still under consideration:  None  Final diagnoses:  Atypical chest pain   Plan discharge  Devoria AlbeIva Trevelle Mcgurn, MD, Concha PyoFACEP    Doris Gruhn, MD 11/30/16 0127

## 2016-11-30 NOTE — Telephone Encounter (Signed)
Informed patient prescription was changed to Dexilant. Patient verbalized understanding.

## 2016-12-12 DIAGNOSIS — M5126 Other intervertebral disc displacement, lumbar region: Secondary | ICD-10-CM | POA: Diagnosis not present

## 2016-12-24 DIAGNOSIS — M5136 Other intervertebral disc degeneration, lumbar region: Secondary | ICD-10-CM | POA: Diagnosis not present

## 2016-12-24 DIAGNOSIS — M5126 Other intervertebral disc displacement, lumbar region: Secondary | ICD-10-CM | POA: Diagnosis not present

## 2017-01-04 ENCOUNTER — Telehealth: Payer: Self-pay | Admitting: Adult Health

## 2017-01-04 MED ORDER — ESTRADIOL 0.1 MG/GM VA CREA: 1.0000 | TOPICAL_CREAM | VAGINAL | 1 refills | Status: DC

## 2017-01-04 NOTE — Telephone Encounter (Signed)
Spoke with pt. Pt has been taking samples of Estrace but has ran out and needs a prescription. Please send to [facility]. Thanks!! JSY

## 2017-01-04 NOTE — Telephone Encounter (Signed)
will refill estrace cream

## 2017-01-07 ENCOUNTER — Telehealth: Payer: Self-pay | Admitting: *Deleted

## 2017-01-07 NOTE — Telephone Encounter (Signed)
Please specify "1 applicatorfull" 1g, 2g,3g,4g?  Thanks

## 2017-01-08 ENCOUNTER — Other Ambulatory Visit: Payer: Self-pay | Admitting: Obstetrics & Gynecology

## 2017-01-08 ENCOUNTER — Telehealth: Payer: Self-pay | Admitting: *Deleted

## 2017-01-08 NOTE — Telephone Encounter (Signed)
1 gram

## 2017-01-08 NOTE — Telephone Encounter (Signed)
Clarification for estrace vaginal cream 1gram called to pharmacy.

## 2017-04-02 DIAGNOSIS — M25561 Pain in right knee: Secondary | ICD-10-CM | POA: Diagnosis not present

## 2017-06-07 ENCOUNTER — Other Ambulatory Visit: Payer: Self-pay | Admitting: Adult Health

## 2017-07-25 DIAGNOSIS — Z23 Encounter for immunization: Secondary | ICD-10-CM | POA: Diagnosis not present

## 2017-08-27 DIAGNOSIS — Z6827 Body mass index (BMI) 27.0-27.9, adult: Secondary | ICD-10-CM | POA: Diagnosis not present

## 2017-08-27 DIAGNOSIS — M5126 Other intervertebral disc displacement, lumbar region: Secondary | ICD-10-CM | POA: Diagnosis not present

## 2017-09-26 ENCOUNTER — Emergency Department (HOSPITAL_COMMUNITY)
Admission: EM | Admit: 2017-09-26 | Discharge: 2017-09-26 | Disposition: A | Discharge status: Home / Self Care | Payer: BLUE CROSS/BLUE SHIELD | Attending: Emergency Medicine | Admitting: Emergency Medicine

## 2017-09-26 ENCOUNTER — Other Ambulatory Visit: Payer: Self-pay

## 2017-09-26 ENCOUNTER — Encounter (HOSPITAL_COMMUNITY): Payer: Self-pay

## 2017-09-26 DIAGNOSIS — R112 Nausea with vomiting, unspecified: Secondary | ICD-10-CM | POA: Insufficient documentation

## 2017-09-26 DIAGNOSIS — R252 Cramp and spasm: Secondary | ICD-10-CM | POA: Diagnosis not present

## 2017-09-26 DIAGNOSIS — R202 Paresthesia of skin: Secondary | ICD-10-CM | POA: Diagnosis not present

## 2017-09-26 DIAGNOSIS — Z79899 Other long term (current) drug therapy: Secondary | ICD-10-CM | POA: Diagnosis not present

## 2017-09-26 DIAGNOSIS — E039 Hypothyroidism, unspecified: Secondary | ICD-10-CM | POA: Diagnosis not present

## 2017-09-26 DIAGNOSIS — R197 Diarrhea, unspecified: Secondary | ICD-10-CM | POA: Diagnosis not present

## 2017-09-26 LAB — CBC WITH DIFFERENTIAL/PLATELET
BASOS ABS: 0 10*3/uL (ref 0.0–0.1)
BASOS PCT: 0 %
EOS ABS: 0.1 10*3/uL (ref 0.0–0.7)
Eosinophils Relative: 1 %
HCT: 41.2 % (ref 36.0–46.0)
HEMOGLOBIN: 13.4 g/dL (ref 12.0–15.0)
Lymphocytes Relative: 4 %
Lymphs Abs: 0.5 10*3/uL — ABNORMAL LOW (ref 0.7–4.0)
MCH: 31 pg (ref 26.0–34.0)
MCHC: 32.5 g/dL (ref 30.0–36.0)
MCV: 95.4 fL (ref 78.0–100.0)
Monocytes Absolute: 0.7 10*3/uL (ref 0.1–1.0)
Monocytes Relative: 6 %
NEUTROS ABS: 10.4 10*3/uL — AB (ref 1.7–7.7)
NEUTROS PCT: 89 %
Platelets: 152 10*3/uL (ref 150–400)
RBC: 4.32 MIL/uL (ref 3.87–5.11)
RDW: 12.6 % (ref 11.5–15.5)
WBC: 11.7 10*3/uL — AB (ref 4.0–10.5)

## 2017-09-26 LAB — BASIC METABOLIC PANEL
ANION GAP: 13 (ref 5–15)
BUN: 24 mg/dL — ABNORMAL HIGH (ref 6–20)
CALCIUM: 9.4 mg/dL (ref 8.9–10.3)
CHLORIDE: 104 mmol/L (ref 101–111)
CO2: 22 mmol/L (ref 22–32)
CREATININE: 0.99 mg/dL (ref 0.44–1.00)
GFR calc non Af Amer: >60 mL/min (ref >60)
Glucose, Bld: 117 mg/dL — ABNORMAL HIGH (ref 65–99)
Potassium: 3.9 mmol/L (ref 3.5–5.1)
SODIUM: 139 mmol/L (ref 135–145)

## 2017-09-26 MED ORDER — ONDANSETRON HCL 4 MG/2ML IJ SOLN
4.0000 mg | Freq: Once | INTRAMUSCULAR | Status: AC
  Administered 2017-09-26: 4 mg via INTRAVENOUS
  Filled 2017-09-26: qty 2

## 2017-09-26 MED ORDER — LORAZEPAM 2 MG/ML IJ SOLN
1.0000 mg | Freq: Once | INTRAMUSCULAR | Status: AC
  Administered 2017-09-26: 1 mg via INTRAVENOUS
  Filled 2017-09-26: qty 1

## 2017-09-26 MED ORDER — SODIUM CHLORIDE 0.9 % IV BOLUS (SEPSIS)
1000.0000 mL | Freq: Once | INTRAVENOUS | Status: AC
  Administered 2017-09-26: 1000 mL via INTRAVENOUS

## 2017-09-26 MED ORDER — METOCLOPRAMIDE HCL 5 MG/ML IJ SOLN
10.0000 mg | Freq: Once | INTRAMUSCULAR | Status: AC
  Administered 2017-09-26: 10 mg via INTRAVENOUS
  Filled 2017-09-26: qty 2

## 2017-09-26 MED ORDER — PROCHLORPERAZINE EDISYLATE 5 MG/ML IJ SOLN
10.0000 mg | Freq: Once | INTRAMUSCULAR | Status: AC
Start: 2017-09-26 — End: 2017-09-26
  Administered 2017-09-26: 10 mg via INTRAVENOUS
  Filled 2017-09-26: qty 2

## 2017-09-26 MED ORDER — LOPERAMIDE HCL 2 MG PO CAPS
4.0000 mg | ORAL_CAPSULE | Freq: Once | ORAL | Status: AC
  Administered 2017-09-26: 4 mg via ORAL
  Filled 2017-09-26: qty 2

## 2017-09-26 MED ORDER — DIPHENHYDRAMINE HCL 50 MG/ML IJ SOLN
25.0000 mg | Freq: Once | INTRAMUSCULAR | Status: AC
Start: 2017-09-26 — End: 2017-09-26
  Administered 2017-09-26: 25 mg via INTRAVENOUS
  Filled 2017-09-26: qty 1

## 2017-09-26 MED ORDER — PROCHLORPERAZINE MALEATE 10 MG PO TABS: 10.0000 mg | ORAL_TABLET | Freq: Four times a day (QID) | ORAL | 0 refills | Status: DC | PRN

## 2017-09-26 NOTE — ED Provider Notes (Signed)
[facility]ERGENCY DEPARTMENT Provider Note   CSN: [id number] Arrival date & time: 09/26/17  0132     History   Chief Complaint Chief Complaint  Patient presents with  . Emesis  . Diarrhea    HPI Dennis BastLisa K [name] is a 56 y.o. female.  The history is provided by the patient.  She has history of hypothyroidism.  She started having nausea at about 9:30 PM.  At 11 PM, she started having vomiting and diarrhea.  There has been associated cramping in her legs and tingling in her fingers.  She denies fever or chills.  She had eaten at a [place] restaurant prior to this, but nobody else who ate there got sick.  She did have a sick contact recently who had similar symptoms.  She did not treat this with anything at home.  Past Medical History:  Diagnosis Date  . Chondromalacia of right patella 08/2015  . Dental crown present   . Family history of adverse reaction to anesthesia    pt's daughter has hx. of post-op N/V  . Heart murmur    states no known problems  . Herniated disc, cervical   . History of hepatitis during college   associated with mono  . Hyperlipidemia   . Hypothyroid 06/16/2015  . Plica syndrome of right knee 08/2015  . Shingles rash 08/05/2015   possible - will start Valtrex today  . Tachycardia    no cardiologist    Patient Active Problem List   Diagnosis Date Noted  . Special screening for malignant neoplasms, colon   . Hypothyroid 06/16/2015  . Vaginal irritation 11/04/2014  . Hematuria 11/04/2014  . Hot flashes 11/04/2014  . Hormone replacement therapy (HRT) 11/04/2014  . PMB (postmenopausal bleeding) 02/15/2014  . RUPTURE ROTATOR CUFF 03/09/2009  . Pain in joint, shoulder region 06/23/2008  . IMPINGEMENT SYNDROME 06/23/2008    Past Surgical History:  Procedure Laterality Date  . APPENDECTOMY  1966  . CHOLECYSTECTOMY  04/18/2007  . CHONDROPLASTY Right 08/11/2015   Procedure: CHONDROPLASTY;  Surgeon: Loreta Aveaniel F Murphy, MD;  Location: [facility];  Service: Orthopedics;  Laterality: Right;  . COLONOSCOPY N/A 07/15/2015   Procedure: COLONOSCOPY;  Surgeon: West BaliSandi L Fields, MD;  [facility];  Service: Endoscopy;  Laterality: N/A;  10:45 AM  . ENDOMETRIAL ABLATION  03/14/2011  . HYSTEROSCOPY W/D&C  03/14/2011  . KNEE ARTHROSCOPY WITH EXCISION PLICA Right 08/11/2015   Procedure: KNEE ARTHROSCOPY WITH EXCISION PLICA;  Surgeon: Loreta Aveaniel F Murphy, MD;  Location: [facility];  Service: Orthopedics;  Laterality: Right;  . SHOULDER ARTHROSCOPY Right     OB History    Gravida Para Term Preterm AB Living   7 6     1 6    SAB TAB Ectopic Multiple Live Births   1               Home Medications    Prior to Admission medications   Medication Sig Start Date End Date Taking? Authorizing Provider  atenolol (TENORMIN) 50 MG tablet 50 mg daily.  02/01/14  Yes [provider]  calcium carbonate (OS-CAL) 600 MG TABS tablet Take 600 mg by mouth daily.    Yes [provider]  Cyanocobalamin (VITAMIN B 12 PO) Take by mouth daily.   Yes [provider]  DUAVEE 0.45-20 MG TABS TAKE 1 TABLET DAILY 06/07/17  Yes Cyril MourningGriffin, Jennifer A, NP  loratadine (CLARITIN) 10 MG tablet Take 10 mg by mouth daily.  Yes [provider]  Multiple Vitamin (MULTIVITAMIN) tablet Take 1 tablet by mouth daily.   Yes [provider]  ondansetron (ZOFRAN) 4 MG tablet Take 1 tablet (4 mg total) by mouth every 8 (eight) hours as needed for nausea or vomiting. 11/16/16  Yes Cyril Mourning A, NP  pantoprazole (PROTONIX) 40 MG tablet Take 1 tablet (40 mg total) by mouth daily. 11/16/16  Yes Cyril Mourning A, NP  SYNTHROID 100 MCG tablet 100 mcg daily.  02/01/14  Yes [provider]  Zolpidem Tartrate (AMBIEN PO) Take by mouth as needed.   Yes [provider]  Ascorbic Acid (VITAMIN C PO) Take by mouth as needed.    [provider]  ciprofloxacin (CIPRO) 500 MG tablet Take 1 tablet  (500 mg total) by mouth 2 (two) times daily. 11/20/16   Adline Potter, NP  dexlansoprazole (DEXILANT) 60 MG capsule Take 1 capsule (60 mg total) by mouth daily. 11/30/16   Lazaro Arms, MD  estradiol (ESTRACE VAGINAL) 0.1 MG/GM vaginal cream Place 1 Applicatorful vaginally 3 (three) times a week. 01/04/17   Adline Potter, NP    Family History Family History  Problem Relation Age of Onset  . Hypertension Mother   . Heart disease Mother        pacemaker  . Dementia Mother   . Hypertension Father   . Parkinson's disease Father   . Mental illness Sister   . Bipolar disorder Sister   . Cancer Maternal Grandmother        breast,eye  . Heart disease Maternal Grandmother   . Heart attack Maternal Grandfather   . Alcohol abuse Paternal Grandfather   . Anesthesia problems Daughter        post-op N/V  . Other Son        Lyme disease    Social History Social History   Tobacco Use  . Smoking status: Never Smoker  . Smokeless tobacco: Never Used  Substance Use Topics  . Alcohol use: No  . Drug use: No     Allergies   Sulfa antibiotics and Macrobid [nitrofurantoin monohyd macro]   Review of Systems Review of Systems  All other systems reviewed and are negative.    Physical Exam Updated Vital Signs BP 119/60   Pulse 80   Temp 98.8 F (37.1 C) (Oral)   Resp 18   Ht 5' 11.5" (1.816 m)   Wt 90.7 kg (200 lb)   SpO2 100%   BMI 27.51 kg/m   Physical Exam  Nursing note and vitals reviewed.  56 year old female, resting comfortably and in no acute distress. Vital signs are normal. Oxygen saturation is 100%, which is normal. Head is normocephalic and atraumatic. PERRLA, EOMI. Oropharynx is clear. Neck is nontender and supple without adenopathy or JVD. Back is nontender and there is no CVA tenderness. Lungs are clear without rales, wheezes, or rhonchi. Chest is nontender. Heart has regular rate and rhythm without murmur. Abdomen is soft, flat, nontender without  masses or hepatosplenomegaly and peristalsis is nslightly hypoactive. Extremities have no cyanosis or edema, full range of motion is present. Skin is warm and dry without rash. Neurologic: Mental status is normal, cranial nerves are intact, there are no motor or sensory deficits.  ED Treatments / Results  Labs (all labs ordered are listed, but only abnormal results are displayed) Labs Reviewed - No data to display   Procedures Procedures (including critical care time)  Medications Ordered in ED Medications  ondansetron (ZOFRAN) injection 4 mg (not administered)  sodium chloride 0.9 % bolus 1,000 mL (not administered)  loperamide (IMODIUM) capsule 4 mg (not administered)     Initial Impression / Assessment and Plan / ED Course  I have reviewed the triage vital signs and the nursing notes.  Pertinent lab results that were available during my care of the patient were reviewed by me and considered in my medical decision making (see chart for details).  Nausea, vomiting, diarrhea which is most likely viral gastroenteritis, possibly food poisoning.  Old records are reviewed, and she has no relevant past visits.  She will be given IV fluids, ondansetron, and oral loperamide.  Following above-noted treatment, she states nausea is only slightly improved, but there is no more sense of impending diarrhea.  She is given metoclopramide with no further improvement.  She was given additional fluids and prochlorperazine, without any further improvement.  She continues to complain of severe nausea although she has not vomited.  She is given a dose of lorazepam following which she states the nausea is slightly improved.  She will be observed in the ED, but anticipate discharge with prescription for prochlorperazine.  Case is signed out to Dr. Clayborne DanaMesner.  Final Clinical Impressions(s) / ED Diagnoses   Final diagnoses:  Nausea vomiting and diarrhea    ED Discharge Orders        Ordered     prochlorperazine (COMPAZINE) 10 MG tablet  Every 6 hours PRN     09/26/17 0641       Dione BoozeGlick, Lovada Barwick, MD 09/26/17 [phone]

## 2017-09-26 NOTE — ED Triage Notes (Signed)
Vomiting and diarrhea onset approx 2130, had [place] food earlier in the evening.

## 2017-09-26 NOTE — ED Provider Notes (Signed)
7:23 AM Assumed care from Dr. Preston FleetingGlick, please see their note for full history, physical and decision making until this point. In brief this is a 56 y.o. year old female who presented to the ED tonight with Emesis and Diarrhea     Multiple rounds of antiemetics. On my exam is resting comfortably and sleepy. Able to wake and answer questions but falls back asleep quickly. Abdomen benign. VS WNL. Will allow to sleep and when awakens will PO challenge for disposition. ecg now to eval QT with the large amount of anti-emetics already given.   Discharge instructions, including strict return precautions for new or worsening symptoms, given. Patient and/or family verbalized understanding and agreement with the plan as described.   Labs, studies and imaging reviewed by myself and considered in medical decision making if ordered. Imaging interpreted by radiology.  Labs Reviewed  BASIC METABOLIC PANEL - Abnormal; Notable for the following components:      Result Value   Glucose, Bld 117 (*)    BUN 24 (*)    All other components within normal limits  CBC WITH DIFFERENTIAL/PLATELET - Abnormal; Notable for the following components:   WBC 11.7 (*)    Neutro Abs 10.4 (*)    Lymphs Abs 0.5 (*)    All other components within normal limits    No orders to display    No Follow-up on file.    Marily MemosMesner, Favor Kreh, MD 09/26/17 1330

## 2017-12-26 DIAGNOSIS — Z0000 Encounter for general adult medical examination without abnormal findings: Secondary | ICD-10-CM | POA: Diagnosis not present

## 2018-01-03 DIAGNOSIS — E785 Hyperlipidemia, unspecified: Secondary | ICD-10-CM | POA: Diagnosis not present

## 2018-01-03 DIAGNOSIS — G4700 Insomnia, unspecified: Secondary | ICD-10-CM | POA: Diagnosis not present

## 2018-01-03 DIAGNOSIS — E039 Hypothyroidism, unspecified: Secondary | ICD-10-CM | POA: Diagnosis not present

## 2018-01-03 DIAGNOSIS — K210 Gastro-esophageal reflux disease with esophagitis: Secondary | ICD-10-CM | POA: Diagnosis not present

## 2018-01-03 DIAGNOSIS — Z0000 Encounter for general adult medical examination without abnormal findings: Secondary | ICD-10-CM | POA: Diagnosis not present

## 2018-01-03 DIAGNOSIS — R739 Hyperglycemia, unspecified: Secondary | ICD-10-CM | POA: Diagnosis not present

## 2018-01-03 LAB — CBC AND DIFFERENTIAL
HCT: 39 (ref 36–46)
Hemoglobin: 13.1 (ref 12.0–16.0)
Neutrophils Absolute: 3
Platelets: 204 (ref 150–399)
WBC: 4.7

## 2018-01-03 LAB — COMPREHENSIVE METABOLIC PANEL
Albumin: 4.3 (ref 3.5–5.0)
Calcium: 9.5 (ref 8.7–10.7)
GFR calc Af Amer: 68
GFR calc non Af Amer: 59

## 2018-01-03 LAB — BASIC METABOLIC PANEL
BUN: 18 (ref 4–21)
CO2: 23 — AB (ref 13–22)
Chloride: 105 (ref 99–108)
Creatinine: 1 (ref <=1.1)
Glucose: 106
Potassium: 4.4 (ref 3.4–5.3)
Sodium: 143 (ref 137–147)

## 2018-01-03 LAB — HEPATIC FUNCTION PANEL
ALT: 28 (ref 7–35)
AST: 28 (ref 13–35)
Alkaline Phosphatase: 68 (ref 25–125)
Bilirubin, Total: 0.3

## 2018-01-03 LAB — HEMOGLOBIN A1C: Hemoglobin A1C: 5.6

## 2018-01-03 LAB — LIPID PANEL
Cholesterol: 227 — AB (ref 0–200)
HDL: 57 (ref 35–70)
LDL Cholesterol: 128
Triglycerides: 211 — AB (ref 40–160)

## 2018-01-03 LAB — CBC: RBC: 4.23 (ref 3.87–5.11)

## 2018-03-18 DIAGNOSIS — M5126 Other intervertebral disc displacement, lumbar region: Secondary | ICD-10-CM | POA: Diagnosis not present

## 2018-03-18 DIAGNOSIS — Z6827 Body mass index (BMI) 27.0-27.9, adult: Secondary | ICD-10-CM | POA: Diagnosis not present

## 2018-04-08 ENCOUNTER — Other Ambulatory Visit (HOSPITAL_COMMUNITY): Payer: Self-pay | Admitting: Pulmonary Disease

## 2018-04-08 DIAGNOSIS — Z1231 Encounter for screening mammogram for malignant neoplasm of breast: Secondary | ICD-10-CM

## 2018-04-10 ENCOUNTER — Ambulatory Visit (HOSPITAL_COMMUNITY)
Admission: RE | Admit: 2018-04-10 | Discharge: 2018-04-10 | Disposition: A | Discharge status: Home / Self Care | Payer: BLUE CROSS/BLUE SHIELD | Source: Ambulatory Visit | Attending: Pulmonary Disease | Admitting: Pulmonary Disease

## 2018-04-10 DIAGNOSIS — Z1231 Encounter for screening mammogram for malignant neoplasm of breast: Secondary | ICD-10-CM | POA: Insufficient documentation

## 2018-06-02 ENCOUNTER — Other Ambulatory Visit: Payer: Self-pay | Admitting: Adult Health

## 2018-08-14 DIAGNOSIS — R21 Rash and other nonspecific skin eruption: Secondary | ICD-10-CM | POA: Diagnosis not present

## 2019-06-12 ENCOUNTER — Other Ambulatory Visit: Payer: Self-pay | Admitting: *Deleted

## 2019-06-12 DIAGNOSIS — Z20822 Contact with and (suspected) exposure to covid-19: Secondary | ICD-10-CM

## 2019-06-12 DIAGNOSIS — R6889 Other general symptoms and signs: Secondary | ICD-10-CM | POA: Diagnosis not present

## 2019-06-13 LAB — NOVEL CORONAVIRUS, NAA: SARS-CoV-2, NAA: NOT DETECTED

## 2019-07-21 ENCOUNTER — Telehealth: Payer: Self-pay | Admitting: *Deleted

## 2019-07-21 NOTE — Telephone Encounter (Signed)
Pt called because her [place] is not available anymore. Wanted to see what she is supposed to do because she has been on it for years. Has visit scheduled in January with Anderson Malta.

## 2019-07-21 NOTE — Telephone Encounter (Signed)
OK noted, she will not take any meds for now

## 2019-07-21 NOTE — Telephone Encounter (Signed)
Left message to call me back, could try the patch,

## 2019-07-21 NOTE — Telephone Encounter (Signed)
Pt states she will try not taking anything for awhile and see how she feels. Will call back if needing medication.

## 2019-07-24 ENCOUNTER — Other Ambulatory Visit (HOSPITAL_COMMUNITY): Payer: Self-pay | Admitting: Adult Health

## 2019-07-24 DIAGNOSIS — Z1231 Encounter for screening mammogram for malignant neoplasm of breast: Secondary | ICD-10-CM

## 2019-08-03 DIAGNOSIS — L600 Ingrowing nail: Secondary | ICD-10-CM | POA: Diagnosis not present

## 2019-08-03 DIAGNOSIS — M79675 Pain in left toe(s): Secondary | ICD-10-CM | POA: Diagnosis not present

## 2019-08-04 DIAGNOSIS — R600 Localized edema: Secondary | ICD-10-CM | POA: Diagnosis not present

## 2019-08-04 DIAGNOSIS — E785 Hyperlipidemia, unspecified: Secondary | ICD-10-CM | POA: Diagnosis not present

## 2019-08-04 DIAGNOSIS — E039 Hypothyroidism, unspecified: Secondary | ICD-10-CM | POA: Diagnosis not present

## 2019-08-04 DIAGNOSIS — Z23 Encounter for immunization: Secondary | ICD-10-CM | POA: Diagnosis not present

## 2019-08-04 DIAGNOSIS — G4700 Insomnia, unspecified: Secondary | ICD-10-CM | POA: Diagnosis not present

## 2019-08-05 DIAGNOSIS — R739 Hyperglycemia, unspecified: Secondary | ICD-10-CM | POA: Diagnosis not present

## 2019-08-05 DIAGNOSIS — R600 Localized edema: Secondary | ICD-10-CM | POA: Diagnosis not present

## 2019-08-05 DIAGNOSIS — E039 Hypothyroidism, unspecified: Secondary | ICD-10-CM | POA: Diagnosis not present

## 2019-08-05 DIAGNOSIS — E785 Hyperlipidemia, unspecified: Secondary | ICD-10-CM | POA: Diagnosis not present

## 2019-08-05 DIAGNOSIS — G4700 Insomnia, unspecified: Secondary | ICD-10-CM | POA: Diagnosis not present

## 2019-08-07 ENCOUNTER — Ambulatory Visit (HOSPITAL_COMMUNITY): Payer: Self-pay

## 2019-08-12 ENCOUNTER — Ambulatory Visit (HOSPITAL_COMMUNITY)
Admission: RE | Admit: 2019-08-12 | Discharge: 2019-08-12 | Disposition: A | Discharge status: Home / Self Care | Payer: BC Managed Care – PPO | Source: Ambulatory Visit | Attending: Adult Health | Admitting: Adult Health

## 2019-08-12 ENCOUNTER — Other Ambulatory Visit: Payer: Self-pay

## 2019-08-12 DIAGNOSIS — Z1231 Encounter for screening mammogram for malignant neoplasm of breast: Secondary | ICD-10-CM | POA: Diagnosis not present

## 2019-08-12 LAB — HM MAMMOGRAPHY

## 2019-08-24 ENCOUNTER — Ambulatory Visit: Payer: BC Managed Care – PPO | Admitting: Family Medicine

## 2019-08-25 ENCOUNTER — Encounter: Payer: Self-pay | Admitting: Family Medicine

## 2019-08-25 ENCOUNTER — Other Ambulatory Visit: Payer: Self-pay

## 2019-08-25 ENCOUNTER — Ambulatory Visit: Payer: BC Managed Care – PPO | Admitting: Family Medicine

## 2019-08-25 VITALS — BP 115/68 | HR 71 | Temp 98.3°F | Ht 72.0 in | Wt 204.2 lb

## 2019-08-25 DIAGNOSIS — M545 Low back pain, unspecified: Secondary | ICD-10-CM | POA: Insufficient documentation

## 2019-08-25 DIAGNOSIS — K219 Gastro-esophageal reflux disease without esophagitis: Secondary | ICD-10-CM | POA: Insufficient documentation

## 2019-08-25 DIAGNOSIS — R000 Tachycardia, unspecified: Secondary | ICD-10-CM | POA: Diagnosis not present

## 2019-08-25 DIAGNOSIS — I10 Essential (primary) hypertension: Secondary | ICD-10-CM | POA: Insufficient documentation

## 2019-08-25 DIAGNOSIS — E039 Hypothyroidism, unspecified: Secondary | ICD-10-CM

## 2019-08-25 NOTE — Patient Instructions (Signed)
Check with insurance to see if DEXA(bone density) covered under insurance

## 2019-08-25 NOTE — Progress Notes (Signed)
New Patient Office Visit  Subjective:  Patient ID: Ashley [name], female    DOB: 05/07/1961  Age: 58 y.o. MRN: [id number]  CC:  Chief Complaint  Patient presents with  . Establish Care  . Joint Swelling    ankles left more than right    HPI Ashley [name] presents for changes in vision noted with vivid color changes after feeling pulsating -pt feels likely occular migraine. -pt with recent eye exam.  Dr. Berkley Harveyotter-vision changed but no new glasses Stress in pts life currently-58yo , 60yo-adopted children-(adopted early in life)-causing stress- Older child with severe anxiety-vomiting , hospitalized last night for anxiety-awaiting bed in behavioral hospital- foster children in a church family-no bio adoption. .   Pt stated her father died in April([place] before he died) -lived in the house for 2 years and children having difficulty losing their grandfather, facetime grandmother([place]-dementia)-no hugs for kids and pt having diffculty.  6 bio children -older and grown with grandchildren. Pt felt led to adopt children but feels stress related to children currently  Back pain with disectomy-mobic daily-achy-improvement post operative GERD-nexium BID-needs twice a day tachycardia-atenolol BID Hyperlipidemia-agree to diet modification-but not medication  Past Medical History:  Diagnosis Date  . Allergy   . Chondromalacia of right patella 08/2015  . Dental crown present   . Family history of adverse reaction to anesthesia    pt's daughter has hx. of post-op N/V  . GERD (gastroesophageal reflux disease)   . Heart murmur    states no known problems  . Herniated disc, cervical   . History of hepatitis during college   associated with mono  . Hyperlipidemia   . Hypothyroid 06/16/2015  . Plica syndrome of right knee 08/2015  . Shingles rash 08/05/2015   possible - will start Valtrex today  . Tachycardia    no cardiologist    Past Surgical History:  Procedure Laterality  Date  . APPENDECTOMY  1966  . CHOLECYSTECTOMY  04/18/2007  . CHONDROPLASTY Right 08/11/2015   Procedure: CHONDROPLASTY;  Surgeon: Loreta Aveaniel F Murphy, MD;  Location: [facility];  Service: Orthopedics;  Laterality: Right;  . COLONOSCOPY N/A 07/15/2015   Procedure: COLONOSCOPY;  Surgeon: West BaliSandi L Fields, MD;  [facility];  Service: Endoscopy;  Laterality: N/A;  10:45 AM  . ENDOMETRIAL ABLATION  03/14/2011  . HYSTEROSCOPY W/D&C  03/14/2011  . KNEE ARTHROSCOPY WITH EXCISION PLICA Right 08/11/2015   Procedure: KNEE ARTHROSCOPY WITH EXCISION PLICA;  Surgeon: Loreta Aveaniel F Murphy, MD;  Location: [facility];  Service: Orthopedics;  Laterality: Right;  . SHOULDER ARTHROSCOPY Right   . SPINE SURGERY      Family History  Problem Relation Age of Onset  . Hypertension Mother   . Heart disease Mother        pacemaker  . Dementia Mother   . Hypertension Father   . Parkinson's disease Father   . Mental illness Sister   . Bipolar disorder Sister   . Cancer Maternal Grandmother        breast,eye  . Heart disease Maternal Grandmother   . Heart attack Maternal Grandfather   . Alcohol abuse Paternal Grandfather   . Anesthesia problems Daughter        post-op N/V  . Other Son        Lyme disease    Social History   Socioeconomic History  . Marital status: Married    Spouse name: Not on file  . Number  of children: Not on file  . Years of education: Not on file  . Highest education level: Not on file  Occupational History  . 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 Financial resource strain: Not on file  . Food insecurity    Worry: Not on file    Inability: Not on file  . Transportation needs    Medical: Not on file    Non-medical: Not on file  Tobacco Use  . Smoking status: Never Smoker  . Smokeless tobacco: Never Used  Substance and Sexual Activity  . Alcohol use: No  . Drug use: No  . Sexual activity: Not Currently     Birth control/protection: None, Post-menopausal, Surgical    Comment: ablation  Lifestyle  . Physical activity    Days per week: Not on file    Minutes per session: Not on file  . Stress: Not on file  Relationships  . Social Herbalist on phone: Not on file    Gets together: Not on file    Attends religious service: Not on file    Active member of club or organization: Not on file    Attends meetings of clubs or organizations: Not on file    Relationship status: Not on file  . Intimate partner violence    Fear of current or ex partner: Not on file    Emotionally abused: Not on file    Physically abused: Not on file    Forced sexual activity: Not on file  Other Topics Concern  . Not on file  Social History Narrative  . Not on file    ROS Review of Systems  Constitutional: Negative.   HENT: Negative.   Eyes:       Visual change with increase stress  Respiratory: Negative.   Cardiovascular: Positive for leg swelling.       Ankle  Gastrointestinal:       GERD  Endocrine: Negative.        Hypothyroid  Genitourinary: Negative.   Musculoskeletal: Positive for back pain and neck pain.  Allergic/Immunologic: Positive for environmental allergies.  Neurological: Positive for dizziness.  Hematological: Negative.   Psychiatric/Behavioral: Negative.     Objective:   Today's Vitals: BP 115/68 (BP Location: Left Arm, Patient Position: Sitting, Cuff Size: Normal)   Pulse 71   Temp 98.3 F (36.8 C) (Oral)   Ht 6' (1.829 m)   Wt 204 lb 3.2 oz (92.6 kg)   SpO2 96%   BMI 27.69 kg/m   Physical Exam Constitutional:      Appearance: Normal appearance. She is normal weight.  HENT:     Head: Normocephalic and atraumatic.     Right Ear: Tympanic membrane normal.     Left Ear: Tympanic membrane normal.     Nose: Nose normal.     Mouth/Throat:     Mouth: Mucous membranes are moist.  Eyes:     Conjunctiva/sclera: Conjunctivae normal.  Neck:     Musculoskeletal:  Normal range of motion and neck supple.  Cardiovascular:     Rate and Rhythm: Normal rate and regular rhythm.     Pulses: Normal pulses.     Heart sounds: Normal heart sounds.  Pulmonary:     Effort: Pulmonary effort is normal.     Breath sounds: Normal breath sounds.  Abdominal:     General: Abdomen is flat.  Musculoskeletal:     Right lower leg: Edema present.  Left lower leg: Edema present.  Neurological:     Mental Status: She is alert and oriented to person, place, and time.  Psychiatric:        Mood and Affect: Mood normal.        Behavior: Behavior normal.     Assessment & Plan:    Outpatient Encounter Medications as of 08/25/2019  Medication Sig  . Ascorbic Acid (VITAMIN C PO) Take by mouth as needed.  Marland Kitchen atenolol (TENORMIN) 50 MG tablet 50 mg daily.   . calcium carbonate (OS-CAL) 600 MG TABS tablet Take 600 mg by mouth daily.   . Cyanocobalamin (VITAMIN B 12 PO) Take by mouth daily.  . meloxicam (MOBIC) 7.5 MG tablet Take 7.5 mg by mouth daily.  . Multiple Vitamin (MULTIVITAMIN) tablet Take 1 tablet by mouth daily.  . pantoprazole (PROTONIX) 40 MG tablet Take 1 tablet (40 mg total) by mouth daily. (Patient not taking: Reported on 08/25/2019)  . prochlorperazine (COMPAZINE) 10 MG tablet Take 1 tablet (10 mg total) by mouth every 6 (six) hours as needed for nausea or vomiting. (Patient not taking: Reported on 08/25/2019)  . SYNTHROID 100 MCG tablet 100 mcg daily.   . [DISCONTINUED] ciprofloxacin (CIPRO) 500 MG tablet Take 1 tablet (500 mg total) by mouth 2 (two) times daily. (Patient not taking: Reported on 08/25/2019)  . [DISCONTINUED] dexlansoprazole (DEXILANT) 60 MG capsule Take 1 capsule (60 mg total) by mouth daily. (Patient not taking: Reported on 08/25/2019)  . [DISCONTINUED] DUAVEE 0.45-20 MG TABS TAKE 1 TABLET DAILY (Patient not taking: Reported on 08/25/2019)  . [DISCONTINUED] estradiol (ESTRACE VAGINAL) 0.1 MG/GM vaginal cream Place 1 Applicatorful vaginally  3 (three) times a week. (Patient not taking: Reported on 08/25/2019)  . [DISCONTINUED] loratadine (CLARITIN) 10 MG tablet Take 10 mg by mouth daily.  . [DISCONTINUED] ondansetron (ZOFRAN) 4 MG tablet Take 1 tablet (4 mg total) by mouth every 8 (eight) hours as needed for nausea or vomiting. (Patient not taking: Reported on 08/25/2019)  . [DISCONTINUED] Zolpidem Tartrate (AMBIEN PO) Take by mouth as needed.   No facility-administered encounter medications on file as of 08/25/2019.   1. Hypothyroidism, unspecified type TSH  2020-will obtain a copy  2. Gastroesophageal reflux disease without esophagitis nexium   3. Midline low back pain, unspecified chronicity, unspecified whether sciatica present mobic daily-suggested stretches/yoga-time for self  4. Tachycardia atenolol  LE trace edema-support socks,elevate-do not recommend medication for treatment Follow-up: 9months  Ashley Mat Carne, MD

## 2019-10-06 DIAGNOSIS — G43109 Migraine with aura, not intractable, without status migrainosus: Secondary | ICD-10-CM | POA: Diagnosis not present

## 2019-10-08 ENCOUNTER — Telehealth: Payer: Self-pay | Admitting: Family Medicine

## 2019-10-08 ENCOUNTER — Ambulatory Visit (INDEPENDENT_AMBULATORY_CARE_PROVIDER_SITE_OTHER): Payer: BC Managed Care – PPO | Admitting: Adult Health

## 2019-10-08 ENCOUNTER — Other Ambulatory Visit (HOSPITAL_COMMUNITY)
Admission: RE | Admit: 2019-10-08 | Discharge: 2019-10-08 | Disposition: A | Discharge status: Home / Self Care | Payer: BC Managed Care – PPO | Source: Ambulatory Visit | Attending: Adult Health | Admitting: Adult Health

## 2019-10-08 ENCOUNTER — Other Ambulatory Visit: Payer: Self-pay

## 2019-10-08 ENCOUNTER — Encounter: Payer: Self-pay | Admitting: Adult Health

## 2019-10-08 VITALS — BP 118/70 | HR 76 | Ht 72.0 in | Wt 203.5 lb

## 2019-10-08 DIAGNOSIS — Z1211 Encounter for screening for malignant neoplasm of colon: Secondary | ICD-10-CM | POA: Insufficient documentation

## 2019-10-08 DIAGNOSIS — Z1212 Encounter for screening for malignant neoplasm of rectum: Secondary | ICD-10-CM

## 2019-10-08 DIAGNOSIS — Z01419 Encounter for gynecological examination (general) (routine) without abnormal findings: Secondary | ICD-10-CM | POA: Insufficient documentation

## 2019-10-08 LAB — HEMOCCULT GUIAC POC 1CARD (OFFICE): Fecal Occult Blood, POC: NEGATIVE

## 2019-10-08 NOTE — Telephone Encounter (Signed)
Patient is calling and states she spoke with [facility] and was informed that Dr. Judee Clara stopped her atenolol (TENORMIN) 50 MG tablet  She would like to know why.

## 2019-10-08 NOTE — Progress Notes (Addendum)
Patient ID: Ashley [name], female   DOB: October 19, 1960, 59 y.o.   MRN: [id number] History of Present Illness:  Ashley [name] is a 59 year old white female,married, PM in for a well woman gyn exam and pap.She is off [facility] and doing well has some hot flashes but feels good.Has had some family stressors with 61 year old.Had some vision issues and had eye exam was normal. Has noticed swelling in ankles esp at night but goes not in am.  She is active. PCP is Dr Judee Clara.   Current Medications, Allergies, Past Medical History, Past Surgical History, Family History and Social History were reviewed in [employer] record.     Review of Systems: Patient denies any headaches, hearing loss, fatigue, blurred vision, shortness of breath, chest pain, abdominal pain, problems with bowel movements, urination, or intercourse(not currently active). No joint pain or mood swings. Has had some family stressors, see HPI.    Physical Exam:BP 118/70 (BP Location: Left Arm, Patient Position: Sitting, Cuff Size: Large)   Pulse 76   Ht 6' (1.829 m)   Wt 203 lb 8 oz (92.3 kg)   BMI 27.60 kg/m  General:  Well developed, well nourished, no acute distress Skin:  Warm and dry Neck:  Midline trachea, normal thyroid, good ROM, no lymphadenopathy,no carotid bruits heard Lungs; Clear to auscultation bilaterally Breast:  No dominant palpable mass, retraction, or nipple discharge Cardiovascular: Regular rate and rhythm Abdomen:  Soft, non tender, no hepatosplenomegaly Pelvic:  External genitalia is normal in appearance, no lesions.  The vagina is normal in appearance for age with loss of color, mositure and ruage. Urethra has no lesions or masses. The cervix is smooth, pap with hight risk HPV 16/18 genotyping performed  Uterus is felt to be normal size, shape, and contour.  No adnexal masses or tenderness noted.Bladder is non tender, no masses felt. Rectal: Good sphincter tone, no polyps, or hemorrhoids felt.   Hemoccult negative. Extremities/musculoskeletal:  No swelling or varicosities noted, no clubbing or cyanosis Psych:  No mood changes, alert and cooperative,seems happy Fall risk is low PHQ 2 score is 0. Examination chaperoned by Malachy Mood LPN  Impression and Plan: 1. Encounter for gynecological examination with Papanicolaou smear of cervix Pap sent Physical in 1 year Pap in 3 if normal Mammogram is normal Labs with PCP Call if when wants DEXA Watch swelling if does not go down or becomes pitting let me or Dr Judee Clara know, decrease salt and sweets     2. Screening for colorectal cancer Colonoscopy per GI

## 2019-10-09 ENCOUNTER — Other Ambulatory Visit: Payer: Self-pay | Admitting: Family Medicine

## 2019-10-09 LAB — CYTOLOGY - PAP
Comment: NEGATIVE
Diagnosis: NEGATIVE
High risk HPV: NEGATIVE

## 2019-10-09 MED ORDER — ATENOLOL 50 MG PO TABS: 50.0000 mg | ORAL_TABLET | Freq: Every day | ORAL | 3 refills | Status: AC

## 2019-10-09 NOTE — Telephone Encounter (Signed)
No request was made to this office for medication refills. My guess is the request was made to a former physician who had not seen the patient in the office and denied the request. I refilled her medication.  If the name of the provider has not been changed on the rx the request goes to the previous provider.

## 2019-11-04 DIAGNOSIS — M47816 Spondylosis without myelopathy or radiculopathy, lumbar region: Secondary | ICD-10-CM | POA: Diagnosis not present

## 2019-11-04 DIAGNOSIS — S233XXA Sprain of ligaments of thoracic spine, initial encounter: Secondary | ICD-10-CM | POA: Diagnosis not present

## 2019-11-04 DIAGNOSIS — M47812 Spondylosis without myelopathy or radiculopathy, cervical region: Secondary | ICD-10-CM | POA: Diagnosis not present

## 2019-11-16 ENCOUNTER — Other Ambulatory Visit: Payer: Self-pay | Admitting: Emergency Medicine

## 2019-11-16 ENCOUNTER — Telehealth: Payer: Self-pay | Admitting: Family Medicine

## 2019-11-16 DIAGNOSIS — K219 Gastro-esophageal reflux disease without esophagitis: Secondary | ICD-10-CM

## 2019-11-16 MED ORDER — ESOMEPRAZOLE MAGNESIUM 40 MG PO CPDR: 40.0000 mg | DELAYED_RELEASE_CAPSULE | Freq: Every day | ORAL | 1 refills | Status: DC

## 2019-11-16 MED ORDER — ESOMEPRAZOLE MAGNESIUM 40 MG PO CPDR: 40.0000 mg | DELAYED_RELEASE_CAPSULE | Freq: Every day | ORAL | 0 refills | Status: AC

## 2019-11-16 NOTE — Telephone Encounter (Signed)
Patient states the pharmacy told her it was too soon to get esomeprazole (NEXIUM) 40 MG capsule And that it was sent in for 1 capsule a day but she has been taking 2x a day. Please advise.

## 2019-11-17 ENCOUNTER — Other Ambulatory Visit: Payer: Self-pay | Admitting: Emergency Medicine

## 2019-11-18 ENCOUNTER — Other Ambulatory Visit: Payer: Self-pay | Admitting: Emergency Medicine

## 2019-11-18 DIAGNOSIS — K219 Gastro-esophageal reflux disease without esophagitis: Secondary | ICD-10-CM

## 2019-11-18 NOTE — Telephone Encounter (Signed)
Patient was informed she is to take this medication twice a day

## 2019-11-25 DIAGNOSIS — M47816 Spondylosis without myelopathy or radiculopathy, lumbar region: Secondary | ICD-10-CM | POA: Diagnosis not present

## 2019-11-25 DIAGNOSIS — S233XXA Sprain of ligaments of thoracic spine, initial encounter: Secondary | ICD-10-CM | POA: Diagnosis not present

## 2019-11-25 DIAGNOSIS — M47812 Spondylosis without myelopathy or radiculopathy, cervical region: Secondary | ICD-10-CM | POA: Diagnosis not present

## 2019-12-01 DIAGNOSIS — S233XXA Sprain of ligaments of thoracic spine, initial encounter: Secondary | ICD-10-CM | POA: Diagnosis not present

## 2019-12-01 DIAGNOSIS — M47816 Spondylosis without myelopathy or radiculopathy, lumbar region: Secondary | ICD-10-CM | POA: Diagnosis not present

## 2019-12-01 DIAGNOSIS — M47812 Spondylosis without myelopathy or radiculopathy, cervical region: Secondary | ICD-10-CM | POA: Diagnosis not present

## 2019-12-28 DIAGNOSIS — S233XXA Sprain of ligaments of thoracic spine, initial encounter: Secondary | ICD-10-CM | POA: Diagnosis not present

## 2019-12-28 DIAGNOSIS — M47816 Spondylosis without myelopathy or radiculopathy, lumbar region: Secondary | ICD-10-CM | POA: Diagnosis not present

## 2019-12-28 DIAGNOSIS — M47812 Spondylosis without myelopathy or radiculopathy, cervical region: Secondary | ICD-10-CM | POA: Diagnosis not present

## 2020-01-18 DIAGNOSIS — M47812 Spondylosis without myelopathy or radiculopathy, cervical region: Secondary | ICD-10-CM | POA: Diagnosis not present

## 2020-01-18 DIAGNOSIS — M47816 Spondylosis without myelopathy or radiculopathy, lumbar region: Secondary | ICD-10-CM | POA: Diagnosis not present

## 2020-01-18 DIAGNOSIS — S233XXA Sprain of ligaments of thoracic spine, initial encounter: Secondary | ICD-10-CM | POA: Diagnosis not present

## 2020-01-26 DIAGNOSIS — M791 Myalgia, unspecified site: Secondary | ICD-10-CM | POA: Diagnosis not present

## 2020-01-26 DIAGNOSIS — Z0189 Encounter for other specified special examinations: Secondary | ICD-10-CM | POA: Diagnosis not present

## 2020-01-26 DIAGNOSIS — G4700 Insomnia, unspecified: Secondary | ICD-10-CM | POA: Diagnosis not present

## 2020-01-26 DIAGNOSIS — E039 Hypothyroidism, unspecified: Secondary | ICD-10-CM | POA: Diagnosis not present

## 2020-02-04 DIAGNOSIS — E039 Hypothyroidism, unspecified: Secondary | ICD-10-CM | POA: Diagnosis not present

## 2020-02-04 DIAGNOSIS — M791 Myalgia, unspecified site: Secondary | ICD-10-CM | POA: Diagnosis not present

## 2020-02-04 DIAGNOSIS — G4700 Insomnia, unspecified: Secondary | ICD-10-CM | POA: Diagnosis not present

## 2020-02-04 DIAGNOSIS — M79642 Pain in left hand: Secondary | ICD-10-CM | POA: Diagnosis not present

## 2020-02-09 DIAGNOSIS — Z0001 Encounter for general adult medical examination with abnormal findings: Secondary | ICD-10-CM | POA: Diagnosis not present

## 2020-02-16 DIAGNOSIS — S233XXA Sprain of ligaments of thoracic spine, initial encounter: Secondary | ICD-10-CM | POA: Diagnosis not present

## 2020-02-16 DIAGNOSIS — M47812 Spondylosis without myelopathy or radiculopathy, cervical region: Secondary | ICD-10-CM | POA: Diagnosis not present

## 2020-02-16 DIAGNOSIS — M47816 Spondylosis without myelopathy or radiculopathy, lumbar region: Secondary | ICD-10-CM | POA: Diagnosis not present

## 2020-03-03 DIAGNOSIS — R683 Clubbing of fingers: Secondary | ICD-10-CM | POA: Diagnosis not present

## 2020-03-03 DIAGNOSIS — M791 Myalgia, unspecified site: Secondary | ICD-10-CM | POA: Diagnosis not present

## 2020-03-03 DIAGNOSIS — E010 Iodine-deficiency related diffuse (endemic) goiter: Secondary | ICD-10-CM | POA: Diagnosis not present

## 2020-03-03 DIAGNOSIS — M79642 Pain in left hand: Secondary | ICD-10-CM | POA: Diagnosis not present

## 2020-03-03 DIAGNOSIS — I7300 Raynaud's syndrome without gangrene: Secondary | ICD-10-CM | POA: Diagnosis not present

## 2020-03-03 DIAGNOSIS — R768 Other specified abnormal immunological findings in serum: Secondary | ICD-10-CM | POA: Diagnosis not present

## 2020-03-14 ENCOUNTER — Encounter (HOSPITAL_COMMUNITY): Payer: Self-pay

## 2020-03-14 ENCOUNTER — Emergency Department (HOSPITAL_COMMUNITY)
Admission: EM | Admit: 2020-03-14 | Discharge: 2020-03-14 | Disposition: A | Discharge status: Home / Self Care | Payer: BC Managed Care – PPO | Attending: Emergency Medicine | Admitting: Emergency Medicine

## 2020-03-14 ENCOUNTER — Other Ambulatory Visit: Payer: Self-pay

## 2020-03-14 DIAGNOSIS — Z203 Contact with and (suspected) exposure to rabies: Secondary | ICD-10-CM | POA: Diagnosis not present

## 2020-03-14 DIAGNOSIS — Z209 Contact with and (suspected) exposure to unspecified communicable disease: Secondary | ICD-10-CM

## 2020-03-14 DIAGNOSIS — Z2914 Encounter for prophylactic rabies immune globin: Secondary | ICD-10-CM | POA: Insufficient documentation

## 2020-03-14 DIAGNOSIS — Z23 Encounter for immunization: Secondary | ICD-10-CM | POA: Diagnosis not present

## 2020-03-14 MED ORDER — RABIES IMMUNE GLOBULIN 150 UNIT/ML IM INJ
20.0000 [IU]/kg | INJECTION | Freq: Once | INTRAMUSCULAR | Status: AC
  Administered 2020-03-14: 1800 [IU] via INTRAMUSCULAR
  Filled 2020-03-14: qty 12

## 2020-03-14 MED ORDER — RABIES VACCINE, PCEC IM SUSR
1.0000 mL | Freq: Once | INTRAMUSCULAR | Status: AC
  Administered 2020-03-14: 1 mL via INTRAMUSCULAR
  Filled 2020-03-14: qty 1

## 2020-03-14 NOTE — ED Triage Notes (Addendum)
Had a bat in house Friday/caught and taken outside,takes several medicines-synthroid,atenolol,nexium,vit b

## 2020-03-14 NOTE — Discharge Instructions (Addendum)
Return to [facility] urgent [facility] in [place] for vaccine dose on days 3, 7, and 14. Call one day in advance to ensure they have supply, if not come to [facility] in [place].  Return for new or concerning symptoms.  

## 2020-03-14 NOTE — ED Provider Notes (Signed)
[facility] EMERGENCY DEPARTMENT Provider Note   CSN: [id number] Arrival date & time: 03/14/20  1300     History Chief Complaint  Patient presents with  . Bat Exposure    Ashley [name] is a 59 y.o. female.  Patient with history of reflux presents after a bout was found in their home in the bathtub, likely has been there since Friday.  No bites known.  Patient has no signs or symptoms since the exposure.  Family let the bat go outside.        Past Medical History:  Diagnosis Date  . Allergy   . Chondromalacia of right patella 08/2015  . Dental crown present   . Family history of adverse reaction to anesthesia    pt's daughter has hx. of post-op N/V  . GERD (gastroesophageal reflux disease)   . Heart murmur    states no known problems  . Herniated disc, cervical   . History of hepatitis during college   associated with mono  . Hyperlipidemia   . Hypothyroid 06/16/2015  . Plica syndrome of right knee 08/2015  . Shingles rash 08/05/2015   possible - will start Valtrex today  . Tachycardia    no cardiologist    Patient Active Problem List   Diagnosis Date Noted  . Screening for colorectal cancer 10/08/2019  . Encounter for gynecological examination with Papanicolaou smear of cervix 10/08/2019  . Hypertension 08/25/2019  . Gastroesophageal reflux disease without esophagitis 08/25/2019  . Midline low back pain 08/25/2019  . Tachycardia 08/25/2019  . Special screening for malignant neoplasms, colon   . Hypothyroid 06/16/2015  . Vaginal irritation 11/04/2014  . Hematuria 11/04/2014  . Hot flashes 11/04/2014  . Hormone replacement therapy (HRT) 11/04/2014  . PMB (postmenopausal bleeding) 02/15/2014  . RUPTURE ROTATOR CUFF 03/09/2009  . Pain in joint, shoulder region 06/23/2008  . IMPINGEMENT SYNDROME 06/23/2008    Past Surgical History:  Procedure Laterality Date  . APPENDECTOMY  1966  . CHOLECYSTECTOMY  04/18/2007  . CHONDROPLASTY Right  08/11/2015   Procedure: CHONDROPLASTY;  Surgeon: Loreta Ave, MD;  Location: [facility];  Service: Orthopedics;  Laterality: Right;  . COLONOSCOPY N/A 07/15/2015   Procedure: COLONOSCOPY;  Surgeon: West Bali, MD;  [facility];  Service: Endoscopy;  Laterality: N/A;  10:45 AM  . ENDOMETRIAL ABLATION  03/14/2011  . HYSTEROSCOPY WITH D & C  03/14/2011  . KNEE ARTHROSCOPY WITH EXCISION PLICA Right 08/11/2015   Procedure: KNEE ARTHROSCOPY WITH EXCISION PLICA;  Surgeon: Loreta Ave, MD;  Location: [facility];  Service: Orthopedics;  Laterality: Right;  . SHOULDER ARTHROSCOPY Right   . SPINE SURGERY       OB History    Gravida  7   Para  6   Term      Preterm      AB  1   Living  6     SAB  1   TAB      Ectopic      Multiple      Live Births              Family History  Problem Relation Age of Onset  . Hypertension Mother   . Heart disease Mother        pacemaker  . Dementia Mother   . Hypertension Father   . Parkinson's disease Father   . Mental illness Sister   . Bipolar disorder Sister   .  Cancer Maternal Grandmother        breast,eye  . Heart disease Maternal Grandmother   . Heart attack Maternal Grandfather   . Alcohol abuse Paternal Grandfather   . Anesthesia problems Daughter        post-op N/V  . Other Son        Lyme disease    Social History   Tobacco Use  . Smoking status: Never Smoker  . Smokeless tobacco: Never Used  Vaping Use  . Vaping Use: Never used  Substance Use Topics  . Alcohol use: No  . Drug use: No    Home Medications Prior to Admission medications   Medication Sig Start Date End Date Taking? Authorizing Provider  atenolol (TENORMIN) 50 MG tablet Take 1 tablet (50 mg total) by mouth daily. 10/09/19   Corum, Rex Kras, MD  calcium carbonate (OS-CAL) 600 MG TABS tablet Take 600 mg by mouth daily.     [provider]  Cyanocobalamin (VITAMIN B 12 PO) Take by mouth  daily.    [provider]  esomeprazole (NEXIUM) 40 MG capsule Take 1 capsule (40 mg total) by mouth daily. 11/16/19   Corum, Rex Kras, MD  esomeprazole (NEXIUM) 40 MG capsule Take 1 capsule (40 mg total) by mouth daily. 11/16/19   Corum, Rex Kras, MD  levothyroxine (SYNTHROID) 112 MCG tablet Take 112 mcg by mouth daily before breakfast.    [provider]  meloxicam (MOBIC) 7.5 MG tablet Take 7.5 mg by mouth daily.    [provider]  Multiple Vitamin (MULTIVITAMIN) tablet Take 1 tablet by mouth daily.    [provider]    Allergies    Sulfa antibiotics, Nitrofurantoin, and Macrobid [nitrofurantoin monohyd macro]  Review of Systems   Review of Systems  Constitutional: Negative for chills and fever.  HENT: Negative for congestion.   Eyes: Negative for visual disturbance.  Respiratory: Negative for shortness of breath.   Cardiovascular: Negative for chest pain.  Gastrointestinal: Negative for abdominal pain and vomiting.  Genitourinary: Negative for dysuria and flank pain.  Musculoskeletal: Negative for back pain, neck pain and neck stiffness.  Skin: Negative for rash.  Neurological: Negative for light-headedness and headaches.    Physical Exam Updated Vital Signs BP 129/69 (BP Location: Left Arm)   Pulse 64   Temp (!) 97.3 F (36.3 C) (Temporal)   Resp 17   Wt 90.7 kg Comment: standing/verified by patient  SpO2 97%   BMI 27.12 kg/m   Physical Exam Vitals and nursing note reviewed.  Constitutional:      Appearance: She is well-developed.  HENT:     Head: Normocephalic and atraumatic.  Eyes:     General:        Right eye: No discharge.        Left eye: No discharge.     Conjunctiva/sclera: Conjunctivae normal.  Neck:     Trachea: No tracheal deviation.  Cardiovascular:     Rate and Rhythm: Normal rate.  Pulmonary:     Effort: Pulmonary effort is normal.  Abdominal:     Palpations: Abdomen is soft.  Musculoskeletal:     Cervical  back: Normal range of motion.  Skin:    General: Skin is warm.     Findings: No rash.  Neurological:     Mental Status: She is alert and oriented to person, place, and time.  Psychiatric:        Mood and Affect: Mood normal.     ED  Results / Procedures / Treatments   Labs (all labs ordered are listed, but only abnormal results are displayed) Labs Reviewed - No data to display  EKG None  Radiology No results found.  Procedures Procedures (including critical care time)  Medications Ordered in ED Medications  rabies immune globulin (HYPERAB/KEDRAB) injection 1,800 Units (has no administration in time range)  rabies vaccine (RABAVERT) injection 1 mL (1 mL Intramuscular Given 03/14/20 1434)    ED Course  I have reviewed the triage vital signs and the nursing notes.  Pertinent labs & imaging results that were available during my care of the patient were reviewed by me and considered in my medical decision making (see chart for details).    MDM Rules/Calculators/A&P                           Patient presents after bat exposure, no signs or symptoms.  Vaccine and immunoglobulin given.  Outpatient follow-up discussed.  Patient can go to urgent care in [place]. Final Clinical Impression(s) / ED Diagnoses Final diagnoses:  Exposure to bat without known bite    Rx / DC Orders ED Discharge Orders    None       Blane Ohara, MD 03/14/20 1520

## 2020-03-14 NOTE — ED Triage Notes (Signed)
Currently in workup for auto immune disease

## 2020-03-14 NOTE — ED Notes (Signed)
Patient awake alert, color pink,chets clear,good areation,no retractions 3plus pulses<2sec refill,patient with family discharged after avs reviewed

## 2020-03-17 ENCOUNTER — Ambulatory Visit
Admission: EM | Admit: 2020-03-17 | Discharge: 2020-03-17 | Disposition: A | Discharge status: Home / Self Care | Payer: BC Managed Care – PPO | Attending: Family Medicine | Admitting: Family Medicine

## 2020-03-17 ENCOUNTER — Other Ambulatory Visit: Payer: Self-pay

## 2020-03-17 DIAGNOSIS — Z203 Contact with and (suspected) exposure to rabies: Secondary | ICD-10-CM | POA: Diagnosis not present

## 2020-03-17 MED ORDER — RABIES VACCINE, PCEC IM SUSR
1.0000 mL | Freq: Once | INTRAMUSCULAR | Status: AC
  Administered 2020-03-17: 1 mL via INTRAMUSCULAR

## 2020-03-17 NOTE — ED Triage Notes (Signed)
Pt here for 2nd rabies vaccine, no concerns  

## 2020-03-21 DIAGNOSIS — M47816 Spondylosis without myelopathy or radiculopathy, lumbar region: Secondary | ICD-10-CM | POA: Diagnosis not present

## 2020-03-21 DIAGNOSIS — M47812 Spondylosis without myelopathy or radiculopathy, cervical region: Secondary | ICD-10-CM | POA: Diagnosis not present

## 2020-03-21 DIAGNOSIS — S233XXA Sprain of ligaments of thoracic spine, initial encounter: Secondary | ICD-10-CM | POA: Diagnosis not present

## 2020-03-24 ENCOUNTER — Other Ambulatory Visit: Payer: Self-pay

## 2020-03-24 ENCOUNTER — Ambulatory Visit
Admission: EM | Admit: 2020-03-24 | Discharge: 2020-03-24 | Disposition: A | Discharge status: Home / Self Care | Payer: BC Managed Care – PPO | Attending: Emergency Medicine | Admitting: Emergency Medicine

## 2020-03-24 DIAGNOSIS — Z23 Encounter for immunization: Secondary | ICD-10-CM

## 2020-03-24 DIAGNOSIS — S233XXA Sprain of ligaments of thoracic spine, initial encounter: Secondary | ICD-10-CM | POA: Diagnosis not present

## 2020-03-24 DIAGNOSIS — M47812 Spondylosis without myelopathy or radiculopathy, cervical region: Secondary | ICD-10-CM | POA: Diagnosis not present

## 2020-03-24 DIAGNOSIS — M47816 Spondylosis without myelopathy or radiculopathy, lumbar region: Secondary | ICD-10-CM | POA: Diagnosis not present

## 2020-03-24 MED ORDER — RABIES VACCINE, PCEC IM SUSR
1.0000 mL | Freq: Once | INTRAMUSCULAR | Status: AC
  Administered 2020-03-24: 1 mL via INTRAMUSCULAR

## 2020-03-29 DIAGNOSIS — I7300 Raynaud's syndrome without gangrene: Secondary | ICD-10-CM | POA: Diagnosis not present

## 2020-03-29 DIAGNOSIS — M791 Myalgia, unspecified site: Secondary | ICD-10-CM | POA: Diagnosis not present

## 2020-03-29 DIAGNOSIS — R768 Other specified abnormal immunological findings in serum: Secondary | ICD-10-CM | POA: Diagnosis not present

## 2020-03-29 DIAGNOSIS — M79642 Pain in left hand: Secondary | ICD-10-CM | POA: Diagnosis not present

## 2020-03-31 ENCOUNTER — Ambulatory Visit
Admission: EM | Admit: 2020-03-31 | Discharge: 2020-03-31 | Disposition: A | Discharge status: Home / Self Care | Payer: BC Managed Care – PPO | Attending: Emergency Medicine | Admitting: Emergency Medicine

## 2020-03-31 DIAGNOSIS — Z203 Contact with and (suspected) exposure to rabies: Secondary | ICD-10-CM

## 2020-03-31 MED ORDER — RABIES VACCINE, PCEC IM SUSR
1.0000 mL | Freq: Once | INTRAMUSCULAR | Status: AC
  Administered 2020-03-31: 1 mL via INTRAMUSCULAR

## 2020-03-31 NOTE — ED Triage Notes (Signed)
Pt here for final rabies vaccine  

## 2020-04-04 DIAGNOSIS — R944 Abnormal results of kidney function studies: Secondary | ICD-10-CM | POA: Diagnosis not present

## 2020-04-04 DIAGNOSIS — G4700 Insomnia, unspecified: Secondary | ICD-10-CM | POA: Diagnosis not present

## 2020-04-04 DIAGNOSIS — M791 Myalgia, unspecified site: Secondary | ICD-10-CM | POA: Diagnosis not present

## 2020-04-04 DIAGNOSIS — E039 Hypothyroidism, unspecified: Secondary | ICD-10-CM | POA: Diagnosis not present

## 2020-04-04 DIAGNOSIS — R769 Abnormal immunological finding in serum, unspecified: Secondary | ICD-10-CM | POA: Diagnosis not present

## 2020-04-04 DIAGNOSIS — E782 Mixed hyperlipidemia: Secondary | ICD-10-CM | POA: Diagnosis not present

## 2020-04-05 ENCOUNTER — Other Ambulatory Visit (HOSPITAL_COMMUNITY): Payer: Self-pay | Admitting: Internal Medicine

## 2020-04-05 ENCOUNTER — Other Ambulatory Visit: Payer: Self-pay | Admitting: Internal Medicine

## 2020-04-05 DIAGNOSIS — E079 Disorder of thyroid, unspecified: Secondary | ICD-10-CM

## 2020-04-06 ENCOUNTER — Other Ambulatory Visit: Payer: Self-pay

## 2020-04-06 LAB — T4: T4,Free (Direct): 0.96

## 2020-04-06 LAB — TSH: TSH: 7.47

## 2020-04-11 ENCOUNTER — Ambulatory Visit (HOSPITAL_COMMUNITY)
Admission: RE | Admit: 2020-04-11 | Discharge: 2020-04-11 | Disposition: A | Discharge status: Home / Self Care | Payer: BC Managed Care – PPO | Source: Ambulatory Visit | Attending: Internal Medicine | Admitting: Internal Medicine

## 2020-04-11 ENCOUNTER — Other Ambulatory Visit: Payer: Self-pay

## 2020-04-11 DIAGNOSIS — E079 Disorder of thyroid, unspecified: Secondary | ICD-10-CM | POA: Diagnosis not present

## 2020-04-11 DIAGNOSIS — E042 Nontoxic multinodular goiter: Secondary | ICD-10-CM | POA: Diagnosis not present

## 2020-04-19 DIAGNOSIS — M47812 Spondylosis without myelopathy or radiculopathy, cervical region: Secondary | ICD-10-CM | POA: Diagnosis not present

## 2020-04-19 DIAGNOSIS — S233XXA Sprain of ligaments of thoracic spine, initial encounter: Secondary | ICD-10-CM | POA: Diagnosis not present

## 2020-04-19 DIAGNOSIS — M47816 Spondylosis without myelopathy or radiculopathy, lumbar region: Secondary | ICD-10-CM | POA: Diagnosis not present

## 2020-04-21 DIAGNOSIS — M47812 Spondylosis without myelopathy or radiculopathy, cervical region: Secondary | ICD-10-CM | POA: Diagnosis not present

## 2020-04-21 DIAGNOSIS — S233XXA Sprain of ligaments of thoracic spine, initial encounter: Secondary | ICD-10-CM | POA: Diagnosis not present

## 2020-04-21 DIAGNOSIS — M47816 Spondylosis without myelopathy or radiculopathy, lumbar region: Secondary | ICD-10-CM | POA: Diagnosis not present

## 2020-05-09 ENCOUNTER — Ambulatory Visit: Payer: BC Managed Care – PPO | Admitting: Endocrinology

## 2020-05-11 ENCOUNTER — Ambulatory Visit: Payer: BC Managed Care – PPO | Admitting: Endocrinology

## 2020-05-18 ENCOUNTER — Other Ambulatory Visit: Payer: Self-pay

## 2020-05-18 ENCOUNTER — Ambulatory Visit
Admission: EM | Admit: 2020-05-18 | Discharge: 2020-05-18 | Disposition: A | Discharge status: Home / Self Care | Payer: BC Managed Care – PPO | Attending: Emergency Medicine | Admitting: Emergency Medicine

## 2020-05-18 ENCOUNTER — Ambulatory Visit: Payer: BC Managed Care – PPO | Admitting: Endocrinology

## 2020-05-18 DIAGNOSIS — H5789 Other specified disorders of eye and adnexa: Secondary | ICD-10-CM | POA: Diagnosis not present

## 2020-05-18 MED ORDER — OFLOXACIN 0.3 % OP SOLN: 1.0000 [drp] | Freq: Four times a day (QID) | OPHTHALMIC | 0 refills | Status: DC

## 2020-05-18 NOTE — ED Provider Notes (Signed)
[facility] CARE [facility]ENTER   [id number] 05/18/20 Arrival Time: 1427  Chief Complaint  Patient presents with  . Eye Pain     SUBJECTIVE:  Ashley [name] is a 59 y.o. female who presents to the urgent care with refill complaint of the right ear irritation and itching for the past few days.  Patient reports worsening symptoms yesterday.  Denies a precipitating event, trauma, or close contacts with similar symptoms.  Has tried OTC Systane eye drops without relief.  Denies alleviating or aggravating factors.  Denies similar symptoms in the past.  Denies fever, chills, nausea, vomiting, eye pain, painful eye movements, halos, discharge, itching, vision changes, double vision, FB sensation, periorbital erythema.     Denies contact lens use.    ROS: As per HPI.  All other pertinent ROS negative.     Past Medical History:  Diagnosis Date  . Allergy   . Chondromalacia of right patella 08/2015  . Dental crown present   . Family history of adverse reaction to anesthesia    pt's daughter has hx. of post-op N/V  . GERD (gastroesophageal reflux disease)   . Heart murmur    states no known problems  . Herniated disc, cervical   . History of hepatitis during college   associated with mono  . Hyperlipidemia   . Hypothyroid 06/16/2015  . Plica syndrome of right knee 08/2015  . Shingles rash 08/05/2015   possible - will start Valtrex today  . Tachycardia    no cardiologist   Past Surgical History:  Procedure Laterality Date  . APPENDECTOMY  1966  . CHOLECYSTECTOMY  04/18/2007  . CHONDROPLASTY Right 08/11/2015   Procedure: CHONDROPLASTY;  Surgeon: Loreta Ave, MD;  Location: [facility];  Service: Orthopedics;  Laterality: Right;  . COLONOSCOPY N/A 07/15/2015   Procedure: COLONOSCOPY;  Surgeon: West Bali, MD;  [facility];  Service: Endoscopy;  Laterality: N/A;  10:45 AM  . ENDOMETRIAL ABLATION  03/14/2011  . HYSTEROSCOPY WITH D & C  03/14/2011  . KNEE  ARTHROSCOPY WITH EXCISION PLICA Right 08/11/2015   Procedure: KNEE ARTHROSCOPY WITH EXCISION PLICA;  Surgeon: Loreta Ave, MD;  Location: [facility];  Service: Orthopedics;  Laterality: Right;  . SHOULDER ARTHROSCOPY Right   . SPINE SURGERY     Allergies  Allergen Reactions  . Sulfa Antibiotics Hives and Itching    SORENESS OF MUSCLES  . Nitrofurantoin   . Macrobid [Nitrofurantoin Monohyd Macro] Rash   No current facility-administered medications on file prior to encounter.   Current Outpatient Medications on File Prior to Encounter  Medication Sig Dispense Refill  . atenolol (TENORMIN) 50 MG tablet Take 1 tablet (50 mg total) by mouth daily. 90 tablet 3  . calcium carbonate (OS-CAL) 600 MG TABS tablet Take 600 mg by mouth daily.     . Cyanocobalamin (VITAMIN B 12 PO) Take by mouth daily.    Marland Kitchen esomeprazole (NEXIUM) 40 MG capsule Take 1 capsule (40 mg total) by mouth daily. 30 capsule 0  . esomeprazole (NEXIUM) 40 MG capsule Take 1 capsule (40 mg total) by mouth daily. 90 capsule 1  . hydrochlorothiazide (HYDRODIURIL) 12.5 MG tablet Take 12.5 mg by mouth daily.    Marland Kitchen levothyroxine (SYNTHROID) 125 MCG tablet Take 125 mcg by mouth daily.    . meloxicam (MOBIC) 7.5 MG tablet Take 7.5 mg by mouth daily.    . Multiple Vitamin (MULTIVITAMIN) tablet Take 1 tablet by mouth daily.  Social History   Socioeconomic History  . Marital status: Married    Spouse name: Not on file  . Number of children: Not on file  . Years of education: Not on file  . Highest education level: Not on file  Occupational History  . Occupation: self employed    Comment: Engineer, agricultural  Tobacco Use  . Smoking status: Never Smoker  . Smokeless tobacco: Never Used  Vaping Use  . Vaping Use: Never used  Substance and Sexual Activity  . Alcohol use: No  . Drug use: No  . Sexual activity: Not Currently    Birth control/protection: Post-menopausal, Surgical    Comment: ablation  Other Topics  Concern  . Not on file  Social History Narrative  . Not on file   Social Determinants of Health   Financial Resource Strain:   . Difficulty of Paying Living Expenses:   Food Insecurity:   . Worried About Programme researcher, broadcasting/film/video in the Last Year:   . Barista in the Last Year:   Transportation Needs:   . Freight forwarder (Medical):   Marland Kitchen Lack of Transportation (Non-Medical):   Physical Activity:   . Days of Exercise per Week:   . Minutes of Exercise per Session:   Stress:   . Feeling of Stress :   Social Connections:   . Frequency of Communication with Friends and Family:   . Frequency of Social Gatherings with Friends and Family:   . Attends Religious Services:   . Active Member of Clubs or Organizations:   . Attends Banker Meetings:   Marland Kitchen Marital Status:   Intimate Partner Violence:   . Fear of Current or Ex-Partner:   . Emotionally Abused:   Marland Kitchen Physically Abused:   . Sexually Abused:    Family History  Problem Relation Age of Onset  . Hypertension Mother   . Heart disease Mother        pacemaker  . Dementia Mother   . Hypertension Father   . Parkinson's disease Father   . Mental illness Sister   . Bipolar disorder Sister   . Cancer Maternal Grandmother        breast,eye  . Heart disease Maternal Grandmother   . Heart attack Maternal Grandfather   . Alcohol abuse Paternal Grandfather   . Anesthesia problems Daughter        post-op N/V  . Other Son        Lyme disease    OBJECTIVE:    Visual Acuity  Right Eye Distance:   Left Eye Distance:   Bilateral Distance:    Right Eye Near:   Left Eye Near:    Bilateral Near:      Vitals:   05/18/20 1522  BP: 106/63  Pulse: (!) 56  Resp: 16  Temp: 98.5 F (36.9 C)  TempSrc: Oral  SpO2: 97%    Physical Exam Vitals and nursing note reviewed.  Constitutional:      General: She is not in acute distress.    Appearance: Normal appearance. She is normal weight. She is not ill-appearing,  toxic-appearing or diaphoretic.  HENT:     Head: Normocephalic.  Eyes:     General: Lids are normal. Lids are everted, no foreign bodies appreciated. Vision grossly intact. Gaze aligned appropriately. No visual field deficit.       Right eye: No foreign body, discharge or hordeolum.        Left eye: No foreign body,  discharge or hordeolum.     Conjunctiva/sclera: Conjunctivae normal.     Right eye: Right conjunctiva is not injected. No chemosis or exudate.    Left eye: Left conjunctiva is not injected. No chemosis or exudate. Cardiovascular:     Rate and Rhythm: Normal rate and regular rhythm.     Pulses: Normal pulses.     Heart sounds: Normal heart sounds. No murmur heard.  No friction rub. No gallop.   Pulmonary:     Effort: Pulmonary effort is normal. No respiratory distress.     Breath sounds: Normal breath sounds. No stridor. No wheezing, rhonchi or rales.  Chest:     Chest wall: No tenderness.  Neurological:     Mental Status: She is alert and oriented to person, place, and time.       ASSESSMENT & PLAN:  1. Eye irritation     Meds ordered this encounter  Medications  . ofloxacin (OCUFLOX) 0.3 % ophthalmic solution    Sig: Place 1 drop into both eyes 4 (four) times daily.    Dispense:  5 mL    Refill:  0   Patient symptom is likely from excessive ear dryness.  Ofloxacin was prescribed to prevent infection.  Advised to continue to use Systane as prescribed for eye dryness  Discharge instructions Use ofloxacin as prescribed and to completion Use OTC ibuprofen or tylenol as needed for pain relief Return here or follow up with ophthamolgy if symptoms persists or worsen such as fever, chills, redness, swelling, eye pain, painful eye movements, vision changes, etc...  Reviewed expectations re: course of current medical issues. Questions answered. Outlined signs and symptoms indicating need for more acute intervention. Patient verbalized understanding. After Visit  Summary given.   Note: This document was prepared using Dragon voice recognition software and may include unintentional dictation errors.    Durward Parcel, FNP 05/18/20 1601

## 2020-05-18 NOTE — ED Triage Notes (Signed)
Pt seen by provider prior to RN 

## 2020-05-18 NOTE — Discharge Instructions (Addendum)
Use ofloxacin as prescribed and to completion Use OTC ibuprofen or tylenol as needed for pain relief Return here or follow up with ophthamolgy if symptoms persists or worsen such as fever, chills, redness, swelling, eye pain, painful eye movements, vision changes, etc... 

## 2020-06-13 DIAGNOSIS — S134XXA Sprain of ligaments of cervical spine, initial encounter: Secondary | ICD-10-CM | POA: Diagnosis not present

## 2020-06-13 DIAGNOSIS — S233XXA Sprain of ligaments of thoracic spine, initial encounter: Secondary | ICD-10-CM | POA: Diagnosis not present

## 2020-06-13 DIAGNOSIS — R42 Dizziness and giddiness: Secondary | ICD-10-CM | POA: Diagnosis not present

## 2020-06-20 DIAGNOSIS — R42 Dizziness and giddiness: Secondary | ICD-10-CM | POA: Diagnosis not present

## 2020-06-20 DIAGNOSIS — S233XXA Sprain of ligaments of thoracic spine, initial encounter: Secondary | ICD-10-CM | POA: Diagnosis not present

## 2020-06-20 DIAGNOSIS — S134XXA Sprain of ligaments of cervical spine, initial encounter: Secondary | ICD-10-CM | POA: Diagnosis not present

## 2020-06-29 DIAGNOSIS — M79642 Pain in left hand: Secondary | ICD-10-CM | POA: Diagnosis not present

## 2020-06-29 DIAGNOSIS — R768 Other specified abnormal immunological findings in serum: Secondary | ICD-10-CM | POA: Diagnosis not present

## 2020-06-29 DIAGNOSIS — I7300 Raynaud's syndrome without gangrene: Secondary | ICD-10-CM | POA: Diagnosis not present

## 2020-06-29 DIAGNOSIS — M791 Myalgia, unspecified site: Secondary | ICD-10-CM | POA: Diagnosis not present

## 2020-06-30 DIAGNOSIS — S233XXA Sprain of ligaments of thoracic spine, initial encounter: Secondary | ICD-10-CM | POA: Diagnosis not present

## 2020-06-30 DIAGNOSIS — S134XXA Sprain of ligaments of cervical spine, initial encounter: Secondary | ICD-10-CM | POA: Diagnosis not present

## 2020-06-30 DIAGNOSIS — R42 Dizziness and giddiness: Secondary | ICD-10-CM | POA: Diagnosis not present

## 2020-07-05 DIAGNOSIS — S233XXA Sprain of ligaments of thoracic spine, initial encounter: Secondary | ICD-10-CM | POA: Diagnosis not present

## 2020-07-05 DIAGNOSIS — S134XXA Sprain of ligaments of cervical spine, initial encounter: Secondary | ICD-10-CM | POA: Diagnosis not present

## 2020-07-05 DIAGNOSIS — R42 Dizziness and giddiness: Secondary | ICD-10-CM | POA: Diagnosis not present

## 2020-07-12 DIAGNOSIS — S233XXA Sprain of ligaments of thoracic spine, initial encounter: Secondary | ICD-10-CM | POA: Diagnosis not present

## 2020-07-12 DIAGNOSIS — R42 Dizziness and giddiness: Secondary | ICD-10-CM | POA: Diagnosis not present

## 2020-07-12 DIAGNOSIS — S134XXA Sprain of ligaments of cervical spine, initial encounter: Secondary | ICD-10-CM | POA: Diagnosis not present

## 2020-07-13 DIAGNOSIS — Z23 Encounter for immunization: Secondary | ICD-10-CM | POA: Diagnosis not present

## 2020-07-14 ENCOUNTER — Ambulatory Visit: Payer: BC Managed Care – PPO | Admitting: Endocrinology

## 2020-07-14 ENCOUNTER — Encounter: Payer: Self-pay | Admitting: Endocrinology

## 2020-07-14 ENCOUNTER — Other Ambulatory Visit: Payer: Self-pay

## 2020-07-14 VITALS — BP 130/80 | HR 68 | Ht 72.0 in | Wt 199.0 lb

## 2020-07-14 DIAGNOSIS — E039 Hypothyroidism, unspecified: Secondary | ICD-10-CM | POA: Diagnosis not present

## 2020-07-14 LAB — T4, FREE: Free T4: 0.8 ng/dL (ref 0.60–1.60)

## 2020-07-14 LAB — TSH: TSH: 5.82 u[IU]/mL — ABNORMAL HIGH (ref 0.35–4.50)

## 2020-07-14 LAB — T3, FREE: T3, Free: 3.3 pg/mL (ref 2.3–4.2)

## 2020-07-14 NOTE — Patient Instructions (Addendum)
Blood tests are requested for you today.  We'll let you know about the results.  I would be happy to see you back here as needed.   

## 2020-07-14 NOTE — Progress Notes (Signed)
Subjective:    Patient ID: Ashley [name], female    DOB: 1961-01-07, 59 y.o.   MRN: [id number]  HPI Pt is referred by Dr Margo Aye, for hypothyroidism.  Pt reports hypothyroidism was dx'ed in 1986, but thyroid function then varied widely during and after several pregnancies.  she has been on Synthroid steadily since soon after dx. She had been on her current 125/d, x a few months.  Prior to that, she was on 112/d.  she has never taken kelp or any other type of non-prescribed thyroid product.  She has never had thyroid surgery, or XRT to the neck.  she has never been on amiodarone or lithium.  She reports fatigue, dry skin, and arthralgias. However, symptoms are improved somewhat with the dosage increase.  She requests Synthroid DAW.   Past Medical History:  Diagnosis Date  . Allergy   . Chondromalacia of right patella 08/2015  . Dental crown present   . Family history of adverse reaction to anesthesia    pt's daughter has hx. of post-op N/V  . GERD (gastroesophageal reflux disease)   . Heart murmur    states no known problems  . Herniated disc, cervical   . History of hepatitis during college   associated with mono  . Hyperlipidemia   . Hypothyroid 06/16/2015  . Plica syndrome of right knee 08/2015  . Shingles rash 08/05/2015   possible - will start Valtrex today  . Tachycardia    no cardiologist    Past Surgical History:  Procedure Laterality Date  . APPENDECTOMY  1966  . CHOLECYSTECTOMY  04/18/2007  . CHONDROPLASTY Right 08/11/2015   Procedure: CHONDROPLASTY;  Surgeon: Loreta Ave, MD;  Location: [facility];  Service: Orthopedics;  Laterality: Right;  . COLONOSCOPY N/A 07/15/2015   Procedure: COLONOSCOPY;  Surgeon: West Bali, MD;  [facility];  Service: Endoscopy;  Laterality: N/A;  10:45 AM  . ENDOMETRIAL ABLATION  03/14/2011  . HYSTEROSCOPY WITH D & C  03/14/2011  . KNEE ARTHROSCOPY WITH EXCISION PLICA Right 08/11/2015   Procedure: KNEE  ARTHROSCOPY WITH EXCISION PLICA;  Surgeon: Loreta Ave, MD;  Location: [facility];  Service: Orthopedics;  Laterality: Right;  . SHOULDER ARTHROSCOPY Right   . SPINE SURGERY      Social History   Socioeconomic History  . Marital status: Married    Spouse name: Not on file  . Number of children: Not on file  . Years of education: Not on file  . Highest education level: Not on file  Occupational History  . Occupation: self employed    Comment: Engineer, agricultural  Tobacco Use  . Smoking status: Never Smoker  . Smokeless tobacco: Never Used  Vaping Use  . Vaping Use: Never used  Substance and Sexual Activity  . Alcohol use: No  . Drug use: No  . Sexual activity: Not Currently    Birth control/protection: Post-menopausal, Surgical    Comment: ablation  Other Topics Concern  . Not on file  Social History Narrative  . Not on file   Social Determinants of Health   Financial Resource Strain:   . Difficulty of Paying Living Expenses: Not on file  Food Insecurity:   . Worried About Programme researcher, broadcasting/film/video in the Last Year: Not on file  . Ran Out of Food in the Last Year: Not on file  Transportation Needs:   . Lack of Transportation (Medical): Not on file  . Lack of  Transportation (Non-Medical): Not on file  Physical Activity:   . Days of Exercise per Week: Not on file  . Minutes of Exercise per Session: Not on file  Stress:   . Feeling of Stress : Not on file  Social Connections:   . Frequency of Communication with Friends and Family: Not on file  . Frequency of Social Gatherings with Friends and Family: Not on file  . Attends Religious Services: Not on file  . Active Member of Clubs or Organizations: Not on file  . Attends Banker Meetings: Not on file  . Marital Status: Not on file  Intimate Partner Violence:   . Fear of Current or Ex-Partner: Not on file  . Emotionally Abused: Not on file  . Physically Abused: Not on file  . Sexually Abused:  Not on file    Current Outpatient Medications on File Prior to Visit  Medication Sig Dispense Refill  . atenolol (TENORMIN) 50 MG tablet Take 1 tablet (50 mg total) by mouth daily. 90 tablet 3  . calcium carbonate (OS-CAL) 600 MG TABS tablet Take 600 mg by mouth daily.     . Cyanocobalamin (VITAMIN B 12 PO) Take by mouth daily.    Marland Kitchen esomeprazole (NEXIUM) 40 MG capsule Take 1 capsule (40 mg total) by mouth daily. 30 capsule 0  . esomeprazole (NEXIUM) 40 MG capsule Take 1 capsule (40 mg total) by mouth daily. 90 capsule 1  . hydrochlorothiazide (HYDRODIURIL) 12.5 MG tablet Take 12.5 mg by mouth daily.    . meloxicam (MOBIC) 7.5 MG tablet Take 7.5 mg by mouth daily.    . Multiple Vitamin (MULTIVITAMIN) tablet Take 1 tablet by mouth daily.    Marland Kitchen ofloxacin (OCUFLOX) 0.3 % ophthalmic solution Place 1 drop into both eyes 4 (four) times daily. 5 mL 0   No current facility-administered medications on file prior to visit.    Allergies  Allergen Reactions  . Sulfa Antibiotics Hives and Itching    SORENESS OF MUSCLES  . Nitrofurantoin   . Macrobid [Nitrofurantoin Monohyd Macro] Rash    Family History  Problem Relation Age of Onset  . Hypertension Mother   . Heart disease Mother        pacemaker  . Dementia Mother   . Hypertension Father   . Parkinson's disease Father   . Thyroid disease Father   . Mental illness Sister   . Bipolar disorder Sister   . Cancer Maternal Grandmother        breast,eye  . Heart disease Maternal Grandmother   . Heart attack Maternal Grandfather   . Alcohol abuse Paternal Grandfather   . Anesthesia problems Daughter        post-op N/V  . Other Son        Lyme disease    BP 130/80   Pulse 68   Ht 6' (1.829 m)   Wt 199 lb (90.3 kg)   SpO2 99%   BMI 26.99 kg/m     Review of Systems denies depression, weight gain, constipation, numbness, and cold intolerance.    Objective:   Physical Exam VS: see vs page GEN: no distress HEAD: head: no  deformity eyes: no periorbital swelling, no proptosis external nose and ears are normal NECK: thyroid is slightly enlarged, with irreg surface.   CHEST WALL: no deformity LUNGS: clear to auscultation CV: reg rate and rhythm, no murmur.  MUSCULOSKELETAL: gait is normal and steady.   EXTEMITIES: no deformity.  Trace bilat leg edema.  NEURO:  cn 2-12 grossly intact.   readily moves all 4's.  sensation is intact to touch on all 4's SKIN:  Normal texture and temperature.  No rash or suspicious lesion is visible.   NODES:  None palpable at the neck PSYCH: alert, well-oriented.  Does not appear anxious nor depressed.     Lab Results  Component Value Date   TSH 7.470 02/04/2020  TPO antibodies: pos  [place]: Thyroid tissue is mildly heterogeneous and lobulated. 2. 1.0 cm isoechoic nodule along the right side of the isthmus. This is a TR 3 nodule and does not meet criteria for biopsy or dedicated follow-up.  I have reviewed outside records, and summarized: Pt was noted to have elevated TSH, and referred here.  Dyslipidemia, PAT, and myalgias were also addressed.  Lab Results  Component Value Date   TSH 5.82 (H) 07/14/2020       Assessment & Plan:  Hypothyroidism, uncontrolled.  I have sent a prescription to your pharmacy, to increase synthroid Recheck labs 30d

## 2020-07-15 ENCOUNTER — Encounter: Payer: Self-pay | Admitting: Endocrinology

## 2020-07-15 DIAGNOSIS — R42 Dizziness and giddiness: Secondary | ICD-10-CM | POA: Diagnosis not present

## 2020-07-15 DIAGNOSIS — S233XXA Sprain of ligaments of thoracic spine, initial encounter: Secondary | ICD-10-CM | POA: Diagnosis not present

## 2020-07-15 DIAGNOSIS — S134XXA Sprain of ligaments of cervical spine, initial encounter: Secondary | ICD-10-CM | POA: Diagnosis not present

## 2020-07-15 MED ORDER — SYNTHROID 137 MCG PO TABS: 137.0000 ug | ORAL_TABLET | Freq: Every day | ORAL | 3 refills | Status: DC

## 2020-07-17 ENCOUNTER — Other Ambulatory Visit: Payer: Self-pay | Admitting: Endocrinology

## 2020-07-17 DIAGNOSIS — R252 Cramp and spasm: Secondary | ICD-10-CM | POA: Insufficient documentation

## 2020-07-18 ENCOUNTER — Telehealth: Payer: Self-pay

## 2020-07-18 NOTE — Telephone Encounter (Signed)
Insurance does not cover brand synthroid.  Alternatives include, euthyrox, levothyroxine sodium, levoxyl, unithroid.  Please advise if change is appropriate.

## 2020-07-18 NOTE — Telephone Encounter (Signed)
Message sent to patient to see what she would like to do about medication.

## 2020-07-18 NOTE — Telephone Encounter (Signed)
Please advise pt of this, and ask her preference

## 2020-07-19 DIAGNOSIS — S233XXA Sprain of ligaments of thoracic spine, initial encounter: Secondary | ICD-10-CM | POA: Diagnosis not present

## 2020-07-19 DIAGNOSIS — S134XXA Sprain of ligaments of cervical spine, initial encounter: Secondary | ICD-10-CM | POA: Diagnosis not present

## 2020-07-19 DIAGNOSIS — R42 Dizziness and giddiness: Secondary | ICD-10-CM | POA: Diagnosis not present

## 2020-07-19 MED ORDER — LEVOTHYROXINE SODIUM 137 MCG PO TABS: 137.0000 ug | ORAL_TABLET | Freq: Every day | ORAL | 3 refills | Status: AC

## 2020-07-20 ENCOUNTER — Ambulatory Visit: Payer: BC Managed Care – PPO | Admitting: Allergy & Immunology

## 2020-07-26 DIAGNOSIS — S134XXA Sprain of ligaments of cervical spine, initial encounter: Secondary | ICD-10-CM | POA: Diagnosis not present

## 2020-07-26 DIAGNOSIS — S233XXA Sprain of ligaments of thoracic spine, initial encounter: Secondary | ICD-10-CM | POA: Diagnosis not present

## 2020-07-26 DIAGNOSIS — R42 Dizziness and giddiness: Secondary | ICD-10-CM | POA: Diagnosis not present

## 2020-07-29 DIAGNOSIS — S233XXA Sprain of ligaments of thoracic spine, initial encounter: Secondary | ICD-10-CM | POA: Diagnosis not present

## 2020-07-29 DIAGNOSIS — R42 Dizziness and giddiness: Secondary | ICD-10-CM | POA: Diagnosis not present

## 2020-07-29 DIAGNOSIS — S134XXA Sprain of ligaments of cervical spine, initial encounter: Secondary | ICD-10-CM | POA: Diagnosis not present

## 2020-08-01 DIAGNOSIS — S134XXA Sprain of ligaments of cervical spine, initial encounter: Secondary | ICD-10-CM | POA: Diagnosis not present

## 2020-08-01 DIAGNOSIS — R42 Dizziness and giddiness: Secondary | ICD-10-CM | POA: Diagnosis not present

## 2020-08-01 DIAGNOSIS — S233XXA Sprain of ligaments of thoracic spine, initial encounter: Secondary | ICD-10-CM | POA: Diagnosis not present

## 2020-08-05 DIAGNOSIS — R42 Dizziness and giddiness: Secondary | ICD-10-CM | POA: Diagnosis not present

## 2020-08-05 DIAGNOSIS — S134XXA Sprain of ligaments of cervical spine, initial encounter: Secondary | ICD-10-CM | POA: Diagnosis not present

## 2020-08-05 DIAGNOSIS — S233XXA Sprain of ligaments of thoracic spine, initial encounter: Secondary | ICD-10-CM | POA: Diagnosis not present

## 2020-08-09 DIAGNOSIS — S233XXA Sprain of ligaments of thoracic spine, initial encounter: Secondary | ICD-10-CM | POA: Diagnosis not present

## 2020-08-09 DIAGNOSIS — S134XXA Sprain of ligaments of cervical spine, initial encounter: Secondary | ICD-10-CM | POA: Diagnosis not present

## 2020-08-09 DIAGNOSIS — R42 Dizziness and giddiness: Secondary | ICD-10-CM | POA: Diagnosis not present

## 2020-08-15 DIAGNOSIS — R600 Localized edema: Secondary | ICD-10-CM | POA: Diagnosis not present

## 2020-08-15 DIAGNOSIS — E782 Mixed hyperlipidemia: Secondary | ICD-10-CM | POA: Diagnosis not present

## 2020-08-15 DIAGNOSIS — G4700 Insomnia, unspecified: Secondary | ICD-10-CM | POA: Diagnosis not present

## 2020-08-15 DIAGNOSIS — E039 Hypothyroidism, unspecified: Secondary | ICD-10-CM | POA: Diagnosis not present

## 2020-08-15 DIAGNOSIS — E663 Overweight: Secondary | ICD-10-CM | POA: Diagnosis not present

## 2020-08-15 DIAGNOSIS — R769 Abnormal immunological finding in serum, unspecified: Secondary | ICD-10-CM | POA: Diagnosis not present

## 2020-08-16 DIAGNOSIS — S233XXA Sprain of ligaments of thoracic spine, initial encounter: Secondary | ICD-10-CM | POA: Diagnosis not present

## 2020-08-16 DIAGNOSIS — S134XXA Sprain of ligaments of cervical spine, initial encounter: Secondary | ICD-10-CM | POA: Diagnosis not present

## 2020-08-16 DIAGNOSIS — R42 Dizziness and giddiness: Secondary | ICD-10-CM | POA: Diagnosis not present

## 2020-08-17 DIAGNOSIS — R944 Abnormal results of kidney function studies: Secondary | ICD-10-CM | POA: Diagnosis not present

## 2020-08-17 DIAGNOSIS — G4700 Insomnia, unspecified: Secondary | ICD-10-CM | POA: Diagnosis not present

## 2020-08-17 DIAGNOSIS — E039 Hypothyroidism, unspecified: Secondary | ICD-10-CM | POA: Diagnosis not present

## 2020-08-17 DIAGNOSIS — R769 Abnormal immunological finding in serum, unspecified: Secondary | ICD-10-CM | POA: Diagnosis not present

## 2020-08-19 ENCOUNTER — Other Ambulatory Visit: Payer: Self-pay | Admitting: Family Medicine

## 2020-08-19 DIAGNOSIS — S134XXA Sprain of ligaments of cervical spine, initial encounter: Secondary | ICD-10-CM | POA: Diagnosis not present

## 2020-08-19 DIAGNOSIS — R42 Dizziness and giddiness: Secondary | ICD-10-CM | POA: Diagnosis not present

## 2020-08-19 DIAGNOSIS — S233XXA Sprain of ligaments of thoracic spine, initial encounter: Secondary | ICD-10-CM | POA: Diagnosis not present

## 2020-08-22 DIAGNOSIS — M79675 Pain in left toe(s): Secondary | ICD-10-CM | POA: Diagnosis not present

## 2020-08-22 DIAGNOSIS — L600 Ingrowing nail: Secondary | ICD-10-CM | POA: Diagnosis not present

## 2020-08-24 ENCOUNTER — Ambulatory Visit: Payer: BC Managed Care – PPO | Admitting: Family Medicine

## 2020-08-24 DIAGNOSIS — R42 Dizziness and giddiness: Secondary | ICD-10-CM | POA: Diagnosis not present

## 2020-08-24 DIAGNOSIS — S134XXA Sprain of ligaments of cervical spine, initial encounter: Secondary | ICD-10-CM | POA: Diagnosis not present

## 2020-08-24 DIAGNOSIS — S233XXA Sprain of ligaments of thoracic spine, initial encounter: Secondary | ICD-10-CM | POA: Diagnosis not present

## 2020-08-30 DIAGNOSIS — S134XXA Sprain of ligaments of cervical spine, initial encounter: Secondary | ICD-10-CM | POA: Diagnosis not present

## 2020-08-30 DIAGNOSIS — S233XXA Sprain of ligaments of thoracic spine, initial encounter: Secondary | ICD-10-CM | POA: Diagnosis not present

## 2020-08-30 DIAGNOSIS — R42 Dizziness and giddiness: Secondary | ICD-10-CM | POA: Diagnosis not present

## 2020-09-16 DIAGNOSIS — R059 Cough, unspecified: Secondary | ICD-10-CM | POA: Diagnosis not present

## 2020-09-16 DIAGNOSIS — R0981 Nasal congestion: Secondary | ICD-10-CM | POA: Diagnosis not present

## 2020-10-06 DIAGNOSIS — R5383 Other fatigue: Secondary | ICD-10-CM | POA: Diagnosis not present

## 2020-10-06 DIAGNOSIS — Z1152 Encounter for screening for COVID-19: Secondary | ICD-10-CM | POA: Diagnosis not present

## 2020-10-06 DIAGNOSIS — R0981 Nasal congestion: Secondary | ICD-10-CM | POA: Diagnosis not present

## 2020-11-02 IMAGING — US US THYROID
1 series · 13 of 25 positions shown · non-contrast
Comparison: None.

CLINICAL DATA: Enlarged thyroid.

EXAM:
THYROID ULTRASOUND
TECHNIQUE: Ultrasound examination of the thyroid gland and adjacent soft
tissues was performed.

[Series 1: us thyroid · 13 of 58 slices shown]
[im 1/58]
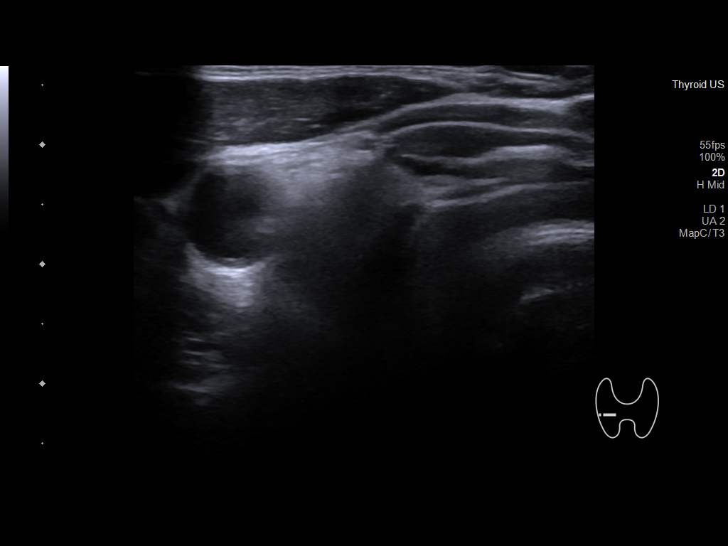
[im 5/58]
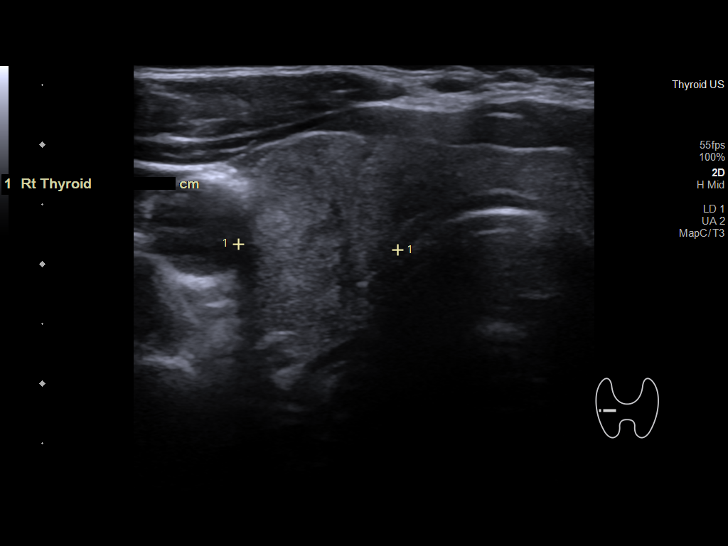
[im 10/58]
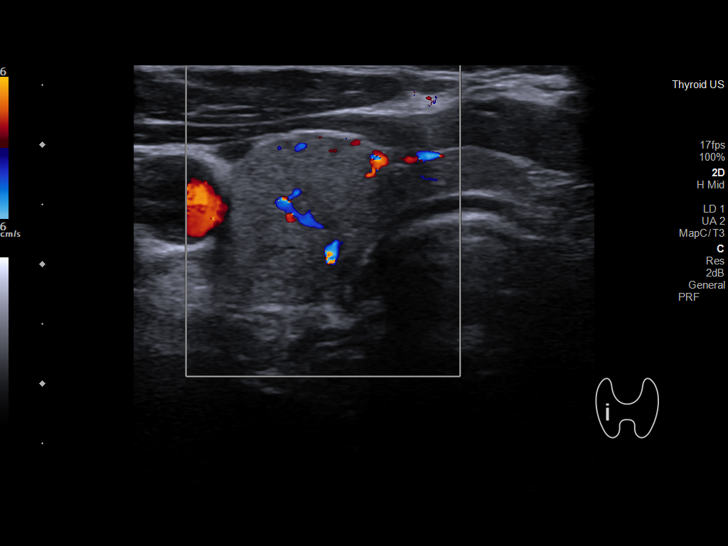
[im 15/58]
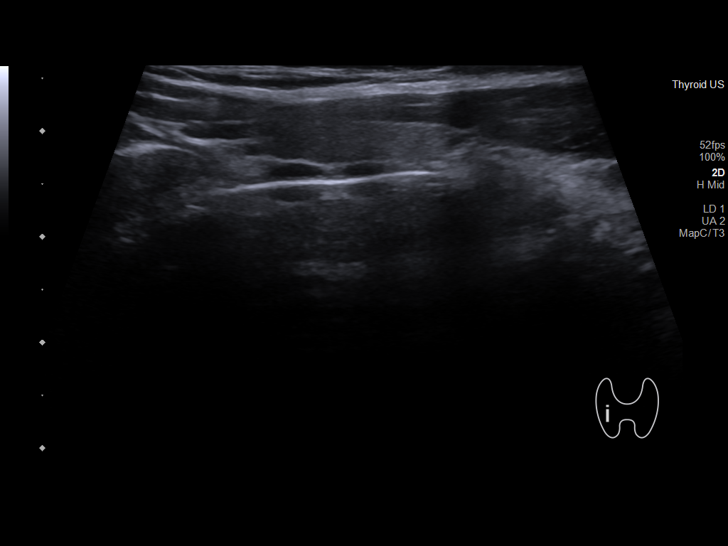
[im 20/58]
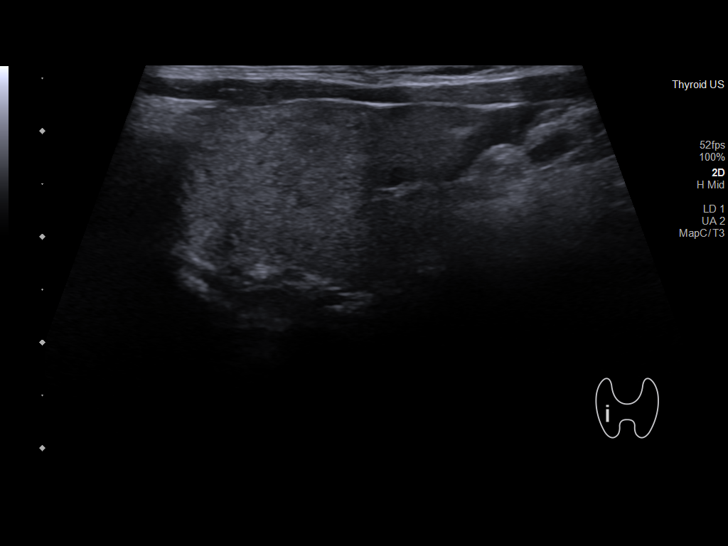
[im 24/58]
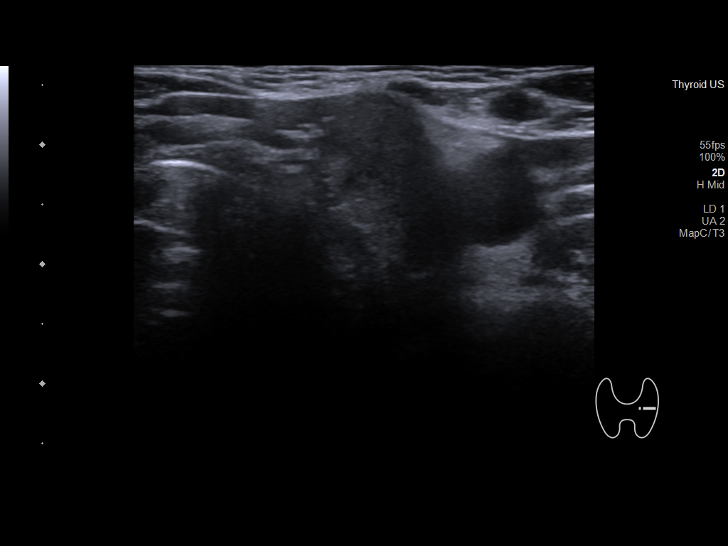
[im 29/58]
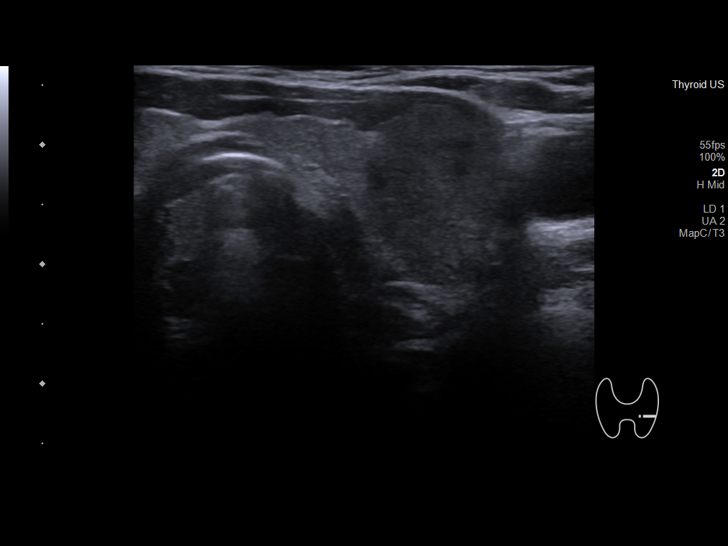
[im 34/58]
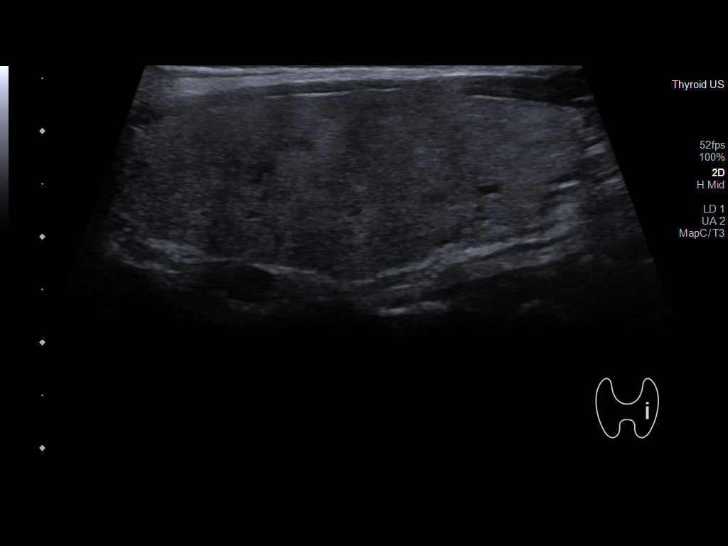
[im 39/58]
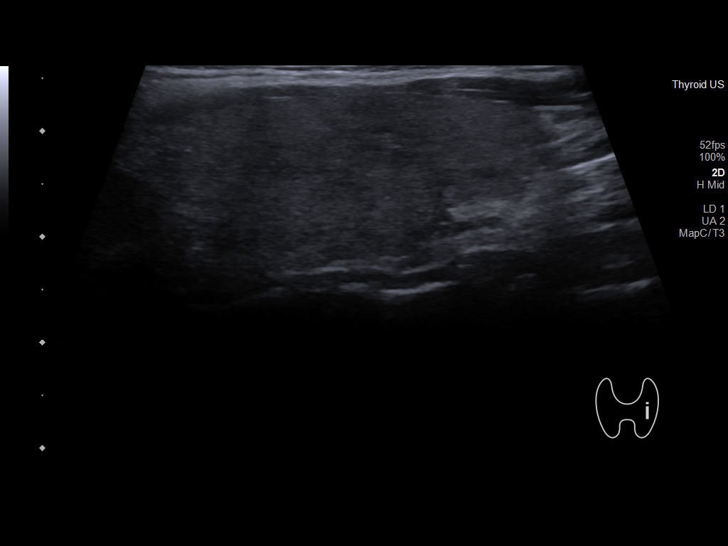
[im 43/58]
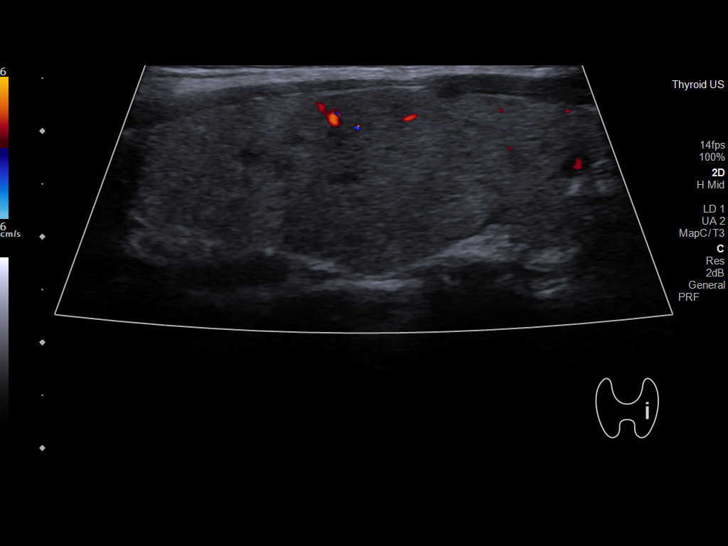
[im 48/58]
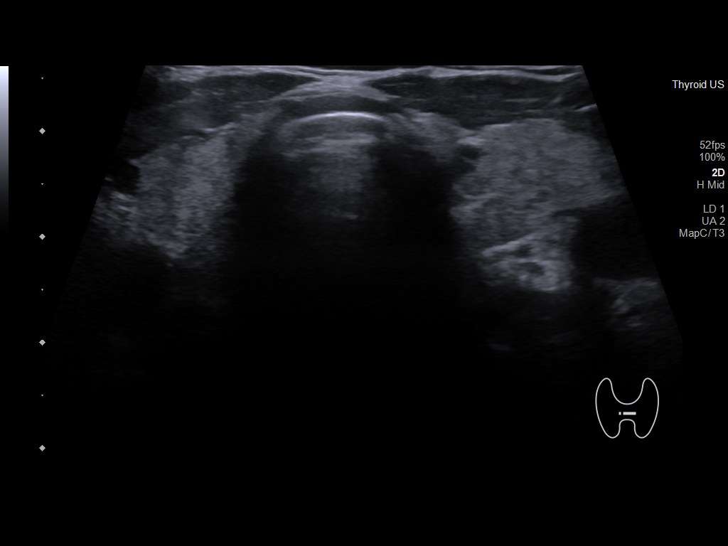
[im 53/58]
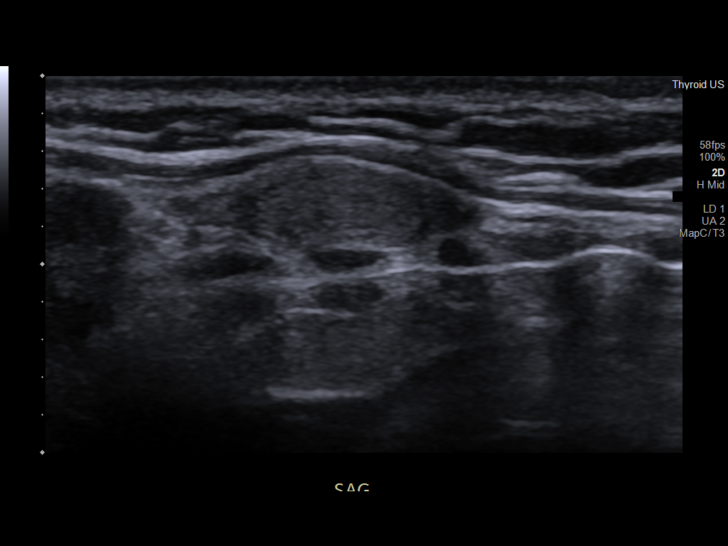
[im 58/58]
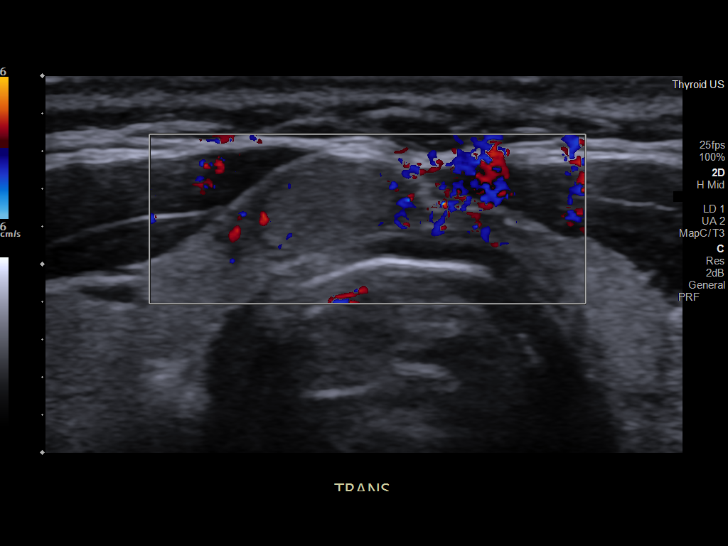

[13 of 25 positions shown; findings below may reference images not displayed]

FINDINGS: Parenchymal Echotexture: Mildly heterogenous

Isthmus: 0.2 cm

Right lobe: 4.3 x 1.9 x 1.3 cm

Left lobe: 4.6 x 2.0 x 1.2 cm

_________________________________________________________

Estimated total number of nodules >/= 1 cm: 1

Number of spongiform nodules >/=  2 cm not described below (TR1): 0

Number of mixed cystic and solid nodules >/= 1.5 cm not described
below (TR2): 0

_________________________________________________________

Left and right thyroid lobes are slightly lobular. No discrete
nodules in the left or right thyroid lobes.

Nodule # 1:

Location: Isthmus; Mid; right

Maximum size: 1.0 cm; Other 2 dimensions: 0.4 x 0.8 cm

Composition: solid/almost completely solid (2)

Echogenicity: isoechoic (1)

Shape: not taller-than-wide (0)

Margins: ill-defined (0)

Echogenic foci: none (0)

ACR TI-RADS total points: 3.

ACR TI-RADS risk category: TR3 (3 points).

ACR TI-RADS recommendations:

Given size (<1.4 cm) and appearance, this nodule does NOT meet
TI-RADS criteria for biopsy or dedicated follow-up.

_________________________________________________________
IMPRESSION: 1. Thyroid tissue is mildly heterogeneous and lobulated.
2. 1.0 cm isoechoic nodule along the right side of the isthmus. This
is a TR 3 nodule and does not meet criteria for biopsy or dedicated
follow-up.

The above is in keeping with the ACR TI-RADS recommendations - [HOSPITAL] 8824;[DATE].

## 2020-11-24 DIAGNOSIS — G4700 Insomnia, unspecified: Secondary | ICD-10-CM | POA: Diagnosis not present

## 2020-11-24 DIAGNOSIS — R600 Localized edema: Secondary | ICD-10-CM | POA: Diagnosis not present

## 2020-11-24 DIAGNOSIS — E039 Hypothyroidism, unspecified: Secondary | ICD-10-CM | POA: Diagnosis not present

## 2020-11-24 DIAGNOSIS — E782 Mixed hyperlipidemia: Secondary | ICD-10-CM | POA: Diagnosis not present

## 2020-11-24 DIAGNOSIS — R769 Abnormal immunological finding in serum, unspecified: Secondary | ICD-10-CM | POA: Diagnosis not present

## 2020-11-24 DIAGNOSIS — E663 Overweight: Secondary | ICD-10-CM | POA: Diagnosis not present

## 2020-11-29 DIAGNOSIS — E039 Hypothyroidism, unspecified: Secondary | ICD-10-CM | POA: Diagnosis not present

## 2020-11-29 DIAGNOSIS — R944 Abnormal results of kidney function studies: Secondary | ICD-10-CM | POA: Diagnosis not present

## 2020-11-29 DIAGNOSIS — G4700 Insomnia, unspecified: Secondary | ICD-10-CM | POA: Diagnosis not present

## 2020-11-29 DIAGNOSIS — R769 Abnormal immunological finding in serum, unspecified: Secondary | ICD-10-CM | POA: Diagnosis not present

## 2020-12-27 DIAGNOSIS — I7300 Raynaud's syndrome without gangrene: Secondary | ICD-10-CM | POA: Diagnosis not present

## 2020-12-27 DIAGNOSIS — R768 Other specified abnormal immunological findings in serum: Secondary | ICD-10-CM | POA: Diagnosis not present

## 2020-12-27 DIAGNOSIS — M79642 Pain in left hand: Secondary | ICD-10-CM | POA: Diagnosis not present

## 2020-12-27 DIAGNOSIS — M791 Myalgia, unspecified site: Secondary | ICD-10-CM | POA: Diagnosis not present

## 2020-12-27 DIAGNOSIS — Z79899 Other long term (current) drug therapy: Secondary | ICD-10-CM | POA: Diagnosis not present

## 2020-12-27 DIAGNOSIS — R220 Localized swelling, mass and lump, head: Secondary | ICD-10-CM | POA: Diagnosis not present

## 2021-01-20 DIAGNOSIS — H04123 Dry eye syndrome of bilateral lacrimal glands: Secondary | ICD-10-CM | POA: Diagnosis not present

## 2021-02-02 ENCOUNTER — Other Ambulatory Visit: Payer: BC Managed Care – PPO

## 2021-04-04 ENCOUNTER — Ambulatory Visit (INDEPENDENT_AMBULATORY_CARE_PROVIDER_SITE_OTHER): Payer: BC Managed Care – PPO | Admitting: Adult Health

## 2021-04-04 ENCOUNTER — Encounter: Payer: Self-pay | Admitting: Adult Health

## 2021-04-04 ENCOUNTER — Other Ambulatory Visit: Payer: Self-pay

## 2021-04-04 VITALS — BP 110/64 | HR 64 | Ht 71.5 in | Wt 202.5 lb

## 2021-04-04 DIAGNOSIS — Z01419 Encounter for gynecological examination (general) (routine) without abnormal findings: Secondary | ICD-10-CM | POA: Diagnosis not present

## 2021-04-04 DIAGNOSIS — Z1211 Encounter for screening for malignant neoplasm of colon: Secondary | ICD-10-CM

## 2021-04-04 DIAGNOSIS — Z1231 Encounter for screening mammogram for malignant neoplasm of breast: Secondary | ICD-10-CM

## 2021-04-04 LAB — HEMOCCULT GUIAC POC 1CARD (OFFICE): Fecal Occult Blood, POC: NEGATIVE

## 2021-04-04 NOTE — Progress Notes (Signed)
Patient ID: Ashley [name], female   DOB: Dec 26, 1960, 60 y.o.   MRN: [id number] History of Present Illness: Ashley [name] is a 60 year old white female,married, PM in for well woman gyn exam. Lab Results  Component Value Date   DIAGPAP  10/08/2019    - Negative for intraepithelial lesion or malignancy (NILM)   HPVHIGH Negative 10/08/2019   PCP is Dr Margo Aye  Current Medications, Allergies, Past Medical History, Past Surgical History, Family History and Social History were reviewed in [facility] electronic medical record.     Review of Systems:  Patient denies any headaches, hearing loss, fatigue, blurred vision, shortness of breath, chest pain, abdominal pain, problems with bowel movements, urination, or intercourse. (Not active). No joint pain or mood swings.  Denies any vaginal bleeding.  Physical Exam:BP 110/64 (BP Location: Left Arm, Patient Position: Sitting, Cuff Size: Large)   Pulse 64   Ht 5' 11.5" (1.816 m)   Wt 202 lb 8 oz (91.9 kg)   BMI 27.85 kg/m   General:  Well developed, well nourished, no acute distress Skin:  Warm and dry Neck:  Midline trachea,slightly enlarged thyroid, good ROM, no lymphadenopathy,no carogid bruits heard. Lungs; Clear to auscultation bilaterally Breast:  No dominant palpable mass, retraction, or nipple discharge Cardiovascular: Regular rate and rhythm Abdomen:  Soft, non tender, no hepatosplenomegaly Pelvic:  External genitalia is normal in appearance, no lesions.  The vagina is normal in appearance. Urethra has no lesions or masses. The cervix is bulbous.  Uterus is felt to be normal size, shape, and contour.  No adnexal masses or tenderness noted.Bladder is non tender, no masses felt. Rectal: Good sphincter tone, no polyps, or hemorrhoids felt.  Hemoccult negative. Extremities/musculoskeletal:  No swelling or varicosities noted, no clubbing or cyanosis Psych:  No mood changes, alert and cooperative,seems happy AA is 0  Fall risk is low Depression  screen [facility] 2/9 04/04/2021 10/08/2019 11/16/2016  Decreased Interest 0 0 0  Down, Depressed, Hopeless 1 0 0  PHQ - 2 Score 1 0 0  Altered sleeping 3 - -  Tired, decreased energy 0 - -  Change in appetite 0 - -  Feeling bad or failure about yourself  0 - -  Trouble concentrating 0 - -  Moving slowly or fidgety/restless 0 - -  Suicidal thoughts 0 - -  PHQ-9 Score 4 - -    GAD 7 : Generalized Anxiety Score 04/04/2021  Nervous, Anxious, on Edge 0  Control/stop worrying 0  Worry too much - different things 0  Trouble relaxing 0  Restless 0  Easily annoyed or irritable 0  Afraid - awful might happen 0  Total GAD 7 Score 0      Upstream - 04/04/21 1031       Pregnancy Intention Screening   Does the patient want to become pregnant in the next year? No    Does the patient's partner want to become pregnant in the next year? No    Would the patient like to discuss contraceptive options today? No      Contraception Wrap Up   Current Method --   PM   End Method --   PM   Contraception Counseling Provided No            Examination chaperoned by Malachy Mood LPN.   Impression and Plan: 1. Screening mammogram for breast cancer Pt will call for appt  2. Encounter for well woman exam with routine gynecological exam Physical in 1 year  Pap in 2024 Labs with PCP  3. Encounter for screening fecal occult blood testing

## 2021-04-21 DIAGNOSIS — M545 Low back pain, unspecified: Secondary | ICD-10-CM | POA: Diagnosis not present

## 2021-04-22 ENCOUNTER — Encounter (HOSPITAL_COMMUNITY): Payer: Self-pay | Admitting: Emergency Medicine

## 2021-04-22 ENCOUNTER — Emergency Department (HOSPITAL_COMMUNITY)
Admission: EM | Admit: 2021-04-22 | Discharge: 2021-04-22 | Disposition: A | Discharge status: Home / Self Care | Payer: BC Managed Care – PPO | Attending: Emergency Medicine | Admitting: Emergency Medicine

## 2021-04-22 ENCOUNTER — Other Ambulatory Visit: Payer: Self-pay

## 2021-04-22 ENCOUNTER — Emergency Department (HOSPITAL_COMMUNITY): Payer: BC Managed Care – PPO

## 2021-04-22 DIAGNOSIS — M545 Low back pain, unspecified: Secondary | ICD-10-CM | POA: Insufficient documentation

## 2021-04-22 DIAGNOSIS — R69 Illness, unspecified: Secondary | ICD-10-CM | POA: Diagnosis not present

## 2021-04-22 DIAGNOSIS — Z79899 Other long term (current) drug therapy: Secondary | ICD-10-CM | POA: Diagnosis not present

## 2021-04-22 DIAGNOSIS — E039 Hypothyroidism, unspecified: Secondary | ICD-10-CM | POA: Diagnosis not present

## 2021-04-22 MED ORDER — KETOROLAC TROMETHAMINE 30 MG/ML IJ SOLN
30.0000 mg | Freq: Once | INTRAMUSCULAR | Status: AC
  Administered 2021-04-22: 30 mg via INTRAVENOUS
  Filled 2021-04-22: qty 1

## 2021-04-22 MED ORDER — OXYCODONE-ACETAMINOPHEN 5-325 MG PO TABS: 1.0000 | ORAL_TABLET | Freq: Four times a day (QID) | ORAL | 0 refills | Status: DC | PRN

## 2021-04-22 MED ORDER — DEXAMETHASONE SODIUM PHOSPHATE 10 MG/ML IJ SOLN
10.0000 mg | Freq: Once | INTRAMUSCULAR | Status: AC
  Administered 2021-04-22: 10 mg via INTRAVENOUS
  Filled 2021-04-22: qty 1

## 2021-04-22 MED ORDER — MORPHINE SULFATE (PF) 4 MG/ML IV SOLN
4.0000 mg | Freq: Once | INTRAVENOUS | Status: AC
  Administered 2021-04-22: 4 mg via INTRAVENOUS
  Filled 2021-04-22: qty 1

## 2021-04-22 NOTE — ED Notes (Signed)
Pt asked for a pillow to be placed under her knees to help with comfort. Placed pillow under pt knees.

## 2021-04-22 NOTE — ED Triage Notes (Signed)
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

## 2021-04-22 NOTE — ED Notes (Signed)
Pt made aware that urinalysis was needed. Unable to give one at this time.

## 2021-04-22 NOTE — ED Provider Notes (Signed)
[facility] EMERGENCY DEPARTMENT Provider Note   CSN: [id number] Arrival date & time: 04/22/21  0941     History Chief Complaint  Patient presents with   Back Pain    Ashley [name] is a 60 y.o. female.  She is complaining of 6 days of low back pain and spasm.  Associated with her leg giving out and some electric shock feeling in her back.  No radiation of the pain down her legs.  No numbness or focal weakness.  No abdominal pain.  No fevers or chills.  She felt nauseous earlier today but she thinks it was related to the pain.  She tried her Robaxin and an oxycodone from her husband without significant improvement.  She saw her primary care doctor yesterday was prescribed Robaxin and steroids but she has not started the steroids yet.  Prior history of disc disease.  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  The history is provided by the patient.  Back Pain Location:  Lumbar spine Quality:  Aching and stabbing Radiates to:  Does not radiate Pain severity:  Severe Pain is:  Same all the time Onset quality:  Gradual Duration:  6 days Timing:  Constant Progression:  Worsening Chronicity:  New Relieved by:  Nothing Worsened by:  Ambulation, bending, twisting and movement Ineffective treatments:  Muscle relaxants and narcotics Associated symptoms: no abdominal pain, no bladder incontinence, no bowel incontinence, no chest pain, no dysuria, no fever, no numbness, no paresthesias and no weakness   Risk factors: no hx of cancer       Past Medical History:  Diagnosis Date   Allergy    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   Shingles rash 08/05/2015   possible - will start Valtrex today   Tachycardia    no cardiologist    Patient Active Problem List   Diagnosis Date Noted   Encounter for screening fecal occult blood testing 04/04/2021   Encounter for well woman exam with routine gynecological exam 04/04/2021   Screening mammogram for breast cancer 04/04/2021   Muscle cramps 07/17/2020   Screening for colorectal cancer 10/08/2019   Encounter for gynecological examination with Papanicolaou smear of cervix 10/08/2019   Hypertension 08/25/2019   Gastroesophageal reflux disease without esophagitis 08/25/2019   Midline low back pain 08/25/2019   Tachycardia 08/25/2019   Special screening for malignant neoplasms, colon    Hypothyroidism 06/16/2015   Vaginal irritation 11/04/2014   Hematuria 11/04/2014   Hot flashes 11/04/2014   Hormone replacement therapy (HRT) 11/04/2014   PMB (postmenopausal bleeding) 02/15/2014   RUPTURE ROTATOR CUFF 03/09/2009   Pain in joint, shoulder region 06/23/2008   IMPINGEMENT SYNDROME 06/23/2008    Past Surgical History:  Procedure Laterality Date   APPENDECTOMY  1966   CHOLECYSTECTOMY  04/18/2007   CHONDROPLASTY Right 08/11/2015   Procedure: CHONDROPLASTY;  Surgeon: Loreta Ave, MD;  Location: [facility];  Service: Orthopedics;  Laterality: Right;   COLONOSCOPY  N/A 07/15/2015   Procedure: COLONOSCOPY;  Surgeon: West Bali, MD;  Location: [facility];  Service: Endoscopy;  Laterality: N/A;  10:45 AM   ENDOMETRIAL ABLATION  03/14/2011   HYSTEROSCOPY WITH D & C  03/14/2011   KNEE ARTHROSCOPY WITH EXCISION PLICA Right 08/11/2015   Procedure: KNEE ARTHROSCOPY WITH EXCISION PLICA;  Surgeon: Loreta Ave, MD;  Location: [facility];  Service: Orthopedics;  Laterality: Right;   SHOULDER ARTHROSCOPY Right    SPINE SURGERY       OB  History     Gravida  7   Para  6   Term      Preterm      AB  1   Living  6      SAB  1   IAB      Ectopic      Multiple      Live Births              Family History  Problem Relation Age of Onset   Alcohol abuse Paternal Grandfather    Cancer Maternal Grandmother        breast,eye   Heart disease Maternal Grandmother    Heart attack Maternal Grandfather    Hypertension Father    Parkinson's disease Father    Thyroid disease Father    Hypertension Mother    Heart disease Mother        pacemaker   Dementia Mother    Mental illness Sister    Bipolar disorder Sister    Anesthesia problems Daughter        post-op N/V   Other Son        Lyme disease    Social History   Tobacco Use   Smoking status: Never   Smokeless tobacco: Never  Vaping Use   Vaping Use: Never used  Substance Use Topics   Alcohol use: No   Drug use: No    Home Medications Prior to Admission medications   Medication Sig Start Date End Date Taking? Authorizing Provider  atenolol (TENORMIN) 50 MG tablet Take 1 tablet (50 mg total) by mouth daily. 10/09/19   Corum, Minerva Fester, MD  calcium carbonate (OS-CAL) 600 MG TABS tablet Take 600 mg by mouth daily.     [provider]  esomeprazole (NEXIUM) 40 MG capsule Take 1 capsule (40 mg total) by mouth daily. 11/16/19   Corum, Minerva Fester, MD  esomeprazole (NEXIUM) 40 MG capsule Take 1 capsule (40 mg total) by mouth daily. 11/16/19   Wandra Feinstein, MD  levothyroxine (SYNTHROID) 137 MCG tablet Take 1 tablet (137 mcg total) by mouth daily before breakfast. 07/19/20   Romero Belling, MD  Multiple Vitamin (MULTIVITAMIN) tablet Take 1 tablet by mouth daily.    [provider]  NAPROXEN PO Take by mouth in the morning and at bedtime.    [provider]  RESTASIS 0.05 % ophthalmic emulsion  03/29/21   [provider]    Allergies    Sulfa antibiotics and Macrobid [nitrofurantoin monohyd macro]  Review of Systems    Review of Systems  Constitutional:  Negative for fever.  HENT:  Negative for sore throat.   Eyes:  Negative for visual disturbance.  Respiratory:  Negative for shortness of breath.   Cardiovascular:  Negative for chest pain.  Gastrointestinal:  Positive for nausea. Negative for abdominal pain and bowel incontinence.  Genitourinary:  Negative for bladder incontinence and dysuria.  Musculoskeletal:  Positive  for back pain.  Skin:  Negative for rash.  Neurological:  Negative for weakness, numbness and paresthesias.   Physical Exam Updated Vital Signs BP 125/68 (BP Location: Left Arm)   Pulse 79   Temp 97.9 F (36.6 C) (Oral)   Resp 20   Ht 5\' 11"  (1.803 m)   Wt 91.6 kg   SpO2 100%   BMI 28.17 kg/m   Physical Exam Vitals and nursing note reviewed.  Constitutional:      General: She is not in acute distress.    Appearance: Normal appearance. She is well-developed.  HENT:     Head: Normocephalic and atraumatic.  Eyes:     Conjunctiva/sclera: Conjunctivae normal.  Cardiovascular:     Rate and Rhythm: Normal rate and regular rhythm.     Heart sounds: No murmur heard. Pulmonary:     Effort: Pulmonary effort is normal. No respiratory distress.     Breath sounds: Normal breath sounds.  Abdominal:     Palpations: Abdomen is soft.     Tenderness: There is no abdominal tenderness. There is no guarding or rebound.  Musculoskeletal:        General: No deformity or signs of injury. Normal range of motion.     Cervical back: Neck supple.  Skin:    General: Skin is warm and dry.  Neurological:     General: No focal deficit present.     Mental Status: She is alert.     Sensory: No sensory deficit.     Motor: No weakness.    ED Results / Procedures / Treatments   Labs (all labs ordered are listed, but only abnormal results are displayed) Labs Reviewed - No data to display  EKG None  Radiology DG Lumbar Spine Complete  Result Date: 04/22/2021 CLINICAL DATA:  Back pain  EXAM: LUMBAR SPINE - COMPLETE 4+ VIEW COMPARISON:  Lumbar MRI 01/23/2012 FINDINGS: There is no evidence of lumbar spine fracture. Alignment is normal. Mild to moderate disc space narrowing at L5-S1 with mild endplate spurring. Mild L4-5 disc space narrowing. Possible generalized osteopenia. IMPRESSION: No acute finding. L4-5 and L5-S1 disc degeneration, also seen in 2013 by MRI. Electronically Signed   By: Marnee SpringJonathon  Watts M.D.   On: 04/22/2021 10:41    Procedures Procedures   Medications Ordered in ED Medications  morphine 4 MG/ML injection 4 mg (has no administration in time range)  ketorolac (TORADOL) 30 MG/ML injection 30 mg (has no administration in time range)  dexamethasone (DECADRON) injection 10 mg (has no administration in time range)    ED Course  I have reviewed the triage vital signs and the nursing notes.  Pertinent labs & imaging results that were available during my care of the patient were reviewed by me and considered in my medical decision making (see chart for details).  Clinical Course as of 04/22/21 1722  Sat Apr 22, 2021  1044 Lumbar spine x-ray ordered and interpreted by me as no acute fractures.   [MB]  1139 Patient's pain is somewhat improved with medication.  Imaging does not show any acute fracture.  No radicular symptoms currently so do not think he needs an emergent MRI.  Recommended close follow-up with PCP. [MB]    Clinical Course User Index [MB] Terrilee FilesButler, Searra Carnathan C, MD   MDM Rules/Calculators/A&P                          This patient complains of low back pain and  spasm; this involves an extensive number of treatment Options and is a complaint that carries with it a high risk of complications and Morbidity. The differential includes musculoskeletal pain, radiculopathy, compression fracture, spinal mass  I ordered medication IV pain medication and steroids I ordered imaging studies which included lumbar spine x-ray and I independently    visualized and  interpreted imaging which showed degenerative changes no acute fracture Previous records obtained and reviewed in epic no recent admissions  After the interventions stated above, I reevaluated the patient and found patient's pain to be better improved.  She remains neurologically intact.  Recommended close follow-up with PCP.  Return instructions discussed.  She already has prescriptions for muscle relaxant and steroid taper.   Final Clinical Impression(s) / ED Diagnoses Final diagnoses:  Acute bilateral low back pain without sciatica    Rx / DC Orders ED Discharge Orders          Ordered    oxyCODONE-acetaminophen (PERCOCET/ROXICET) 5-325 MG tablet  Every 6 hours PRN        04/22/21 1141             Terrilee Files, MD 04/22/21 1724

## 2021-04-22 NOTE — Discharge Instructions (Addendum)
You were seen in the emergency department for low back pain and spasm.  You had x-rays that did not show an obvious cause of your pain.  Please continue the Aleve and start the steroids tomorrow.  We are prescribing you some pain medicine.  Can also use the muscle relaxant.  Contact your primary care doctor for close follow-up.  You may end up needing an MRI for further evaluation of your back pain.  Return if any worsening or concerning symptoms

## 2021-04-24 ENCOUNTER — Other Ambulatory Visit (HOSPITAL_COMMUNITY): Payer: Self-pay | Admitting: Internal Medicine

## 2021-04-24 DIAGNOSIS — M545 Low back pain, unspecified: Secondary | ICD-10-CM

## 2021-07-11 DIAGNOSIS — L821 Other seborrheic keratosis: Secondary | ICD-10-CM | POA: Diagnosis not present

## 2021-07-11 DIAGNOSIS — I788 Other diseases of capillaries: Secondary | ICD-10-CM | POA: Diagnosis not present

## 2021-07-25 DIAGNOSIS — E782 Mixed hyperlipidemia: Secondary | ICD-10-CM | POA: Diagnosis not present

## 2021-07-26 ENCOUNTER — Other Ambulatory Visit (HOSPITAL_COMMUNITY): Payer: Self-pay | Admitting: Family Medicine

## 2021-07-26 DIAGNOSIS — Z1231 Encounter for screening mammogram for malignant neoplasm of breast: Secondary | ICD-10-CM

## 2021-07-26 DIAGNOSIS — Z23 Encounter for immunization: Secondary | ICD-10-CM | POA: Diagnosis not present

## 2021-07-26 DIAGNOSIS — Z0001 Encounter for general adult medical examination with abnormal findings: Secondary | ICD-10-CM | POA: Diagnosis not present

## 2021-07-31 DIAGNOSIS — I7300 Raynaud's syndrome without gangrene: Secondary | ICD-10-CM | POA: Diagnosis not present

## 2021-07-31 DIAGNOSIS — M791 Myalgia, unspecified site: Secondary | ICD-10-CM | POA: Diagnosis not present

## 2021-07-31 DIAGNOSIS — R768 Other specified abnormal immunological findings in serum: Secondary | ICD-10-CM | POA: Diagnosis not present

## 2021-07-31 DIAGNOSIS — M79642 Pain in left hand: Secondary | ICD-10-CM | POA: Diagnosis not present

## 2021-08-02 ENCOUNTER — Other Ambulatory Visit (HOSPITAL_COMMUNITY): Payer: Self-pay | Admitting: Family Medicine

## 2021-08-02 ENCOUNTER — Other Ambulatory Visit: Payer: Self-pay | Admitting: Family Medicine

## 2021-08-02 DIAGNOSIS — E041 Nontoxic single thyroid nodule: Secondary | ICD-10-CM

## 2021-08-09 ENCOUNTER — Ambulatory Visit (HOSPITAL_COMMUNITY)
Admission: RE | Admit: 2021-08-09 | Discharge: 2021-08-09 | Disposition: A | Discharge status: Home / Self Care | Payer: BC Managed Care – PPO | Source: Ambulatory Visit | Attending: Family Medicine | Admitting: Family Medicine

## 2021-08-09 ENCOUNTER — Other Ambulatory Visit: Payer: Self-pay

## 2021-08-09 DIAGNOSIS — Z1231 Encounter for screening mammogram for malignant neoplasm of breast: Secondary | ICD-10-CM | POA: Diagnosis not present

## 2021-08-10 ENCOUNTER — Ambulatory Visit (HOSPITAL_COMMUNITY)
Admission: RE | Admit: 2021-08-10 | Discharge: 2021-08-10 | Disposition: A | Discharge status: Home / Self Care | Payer: BC Managed Care – PPO | Source: Ambulatory Visit | Attending: Family Medicine | Admitting: Family Medicine

## 2021-08-10 DIAGNOSIS — L0100 Impetigo, unspecified: Secondary | ICD-10-CM | POA: Diagnosis not present

## 2021-08-10 DIAGNOSIS — E041 Nontoxic single thyroid nodule: Secondary | ICD-10-CM | POA: Diagnosis not present

## 2021-11-13 IMAGING — DX DG LUMBAR SPINE COMPLETE 4+V
5 series · 5 of 5 positions shown · non-contrast
Comparison: Lumbar MRI 01/23/2012

CLINICAL DATA: Back pain

EXAM:
LUMBAR SPINE - COMPLETE 4+ VIEW

[l-spine ap]
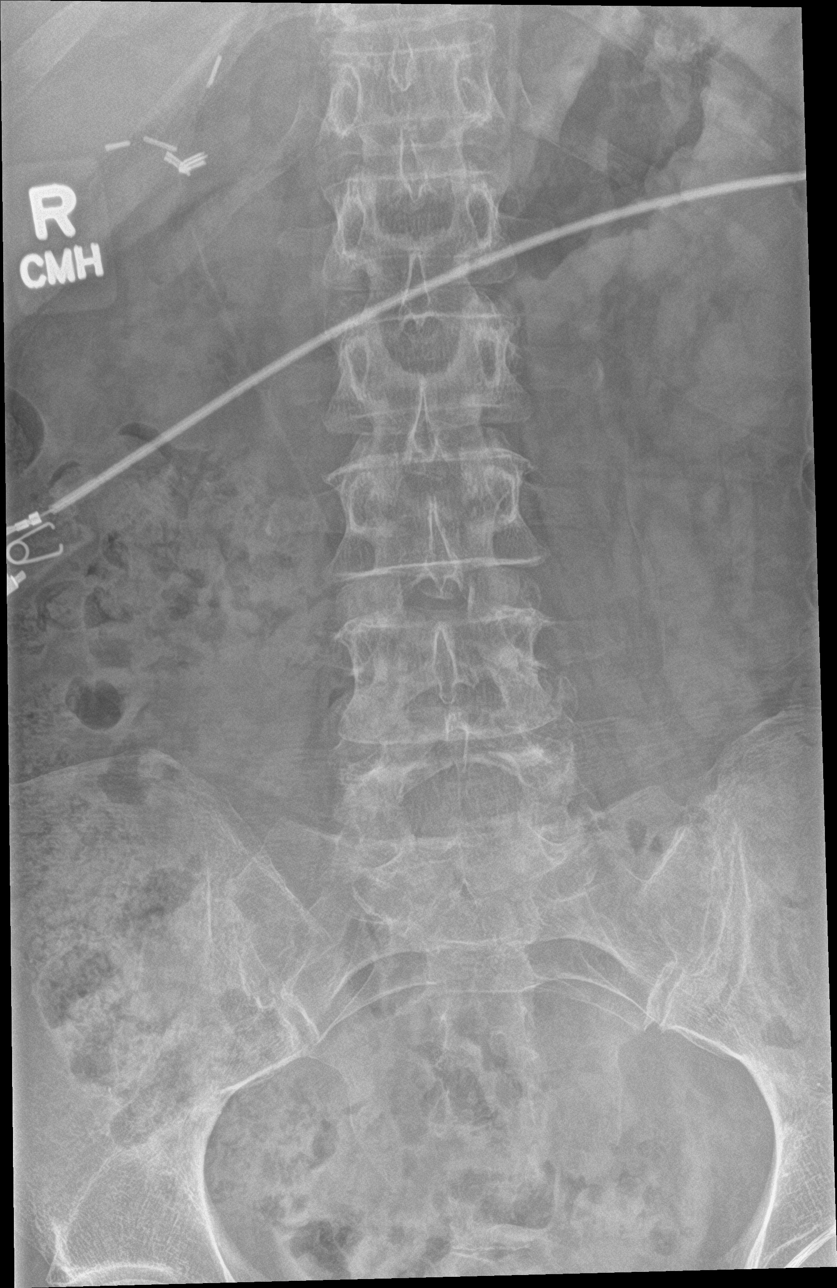

[l-spine obl (1 of 2)]
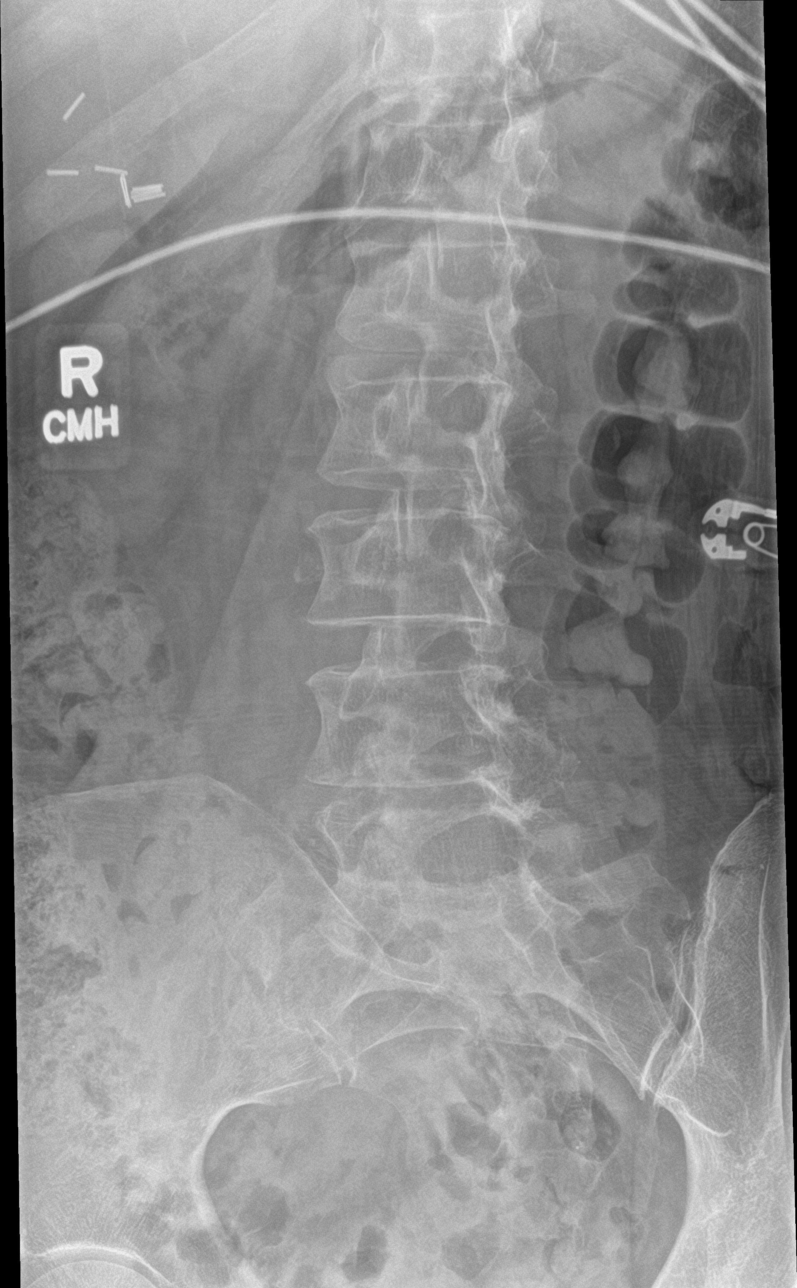

[l-spine obl (2 of 2)]
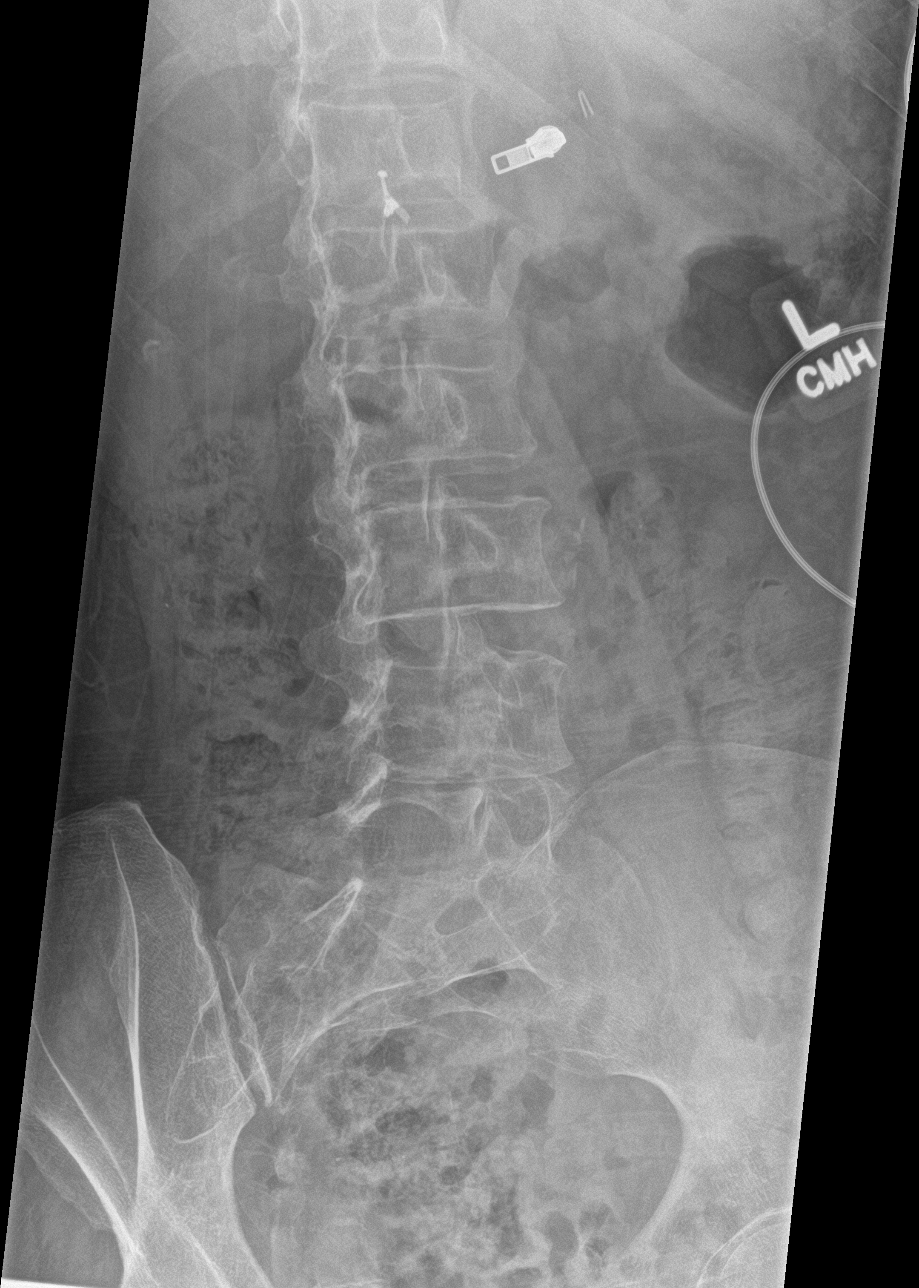

[l-spine lat]
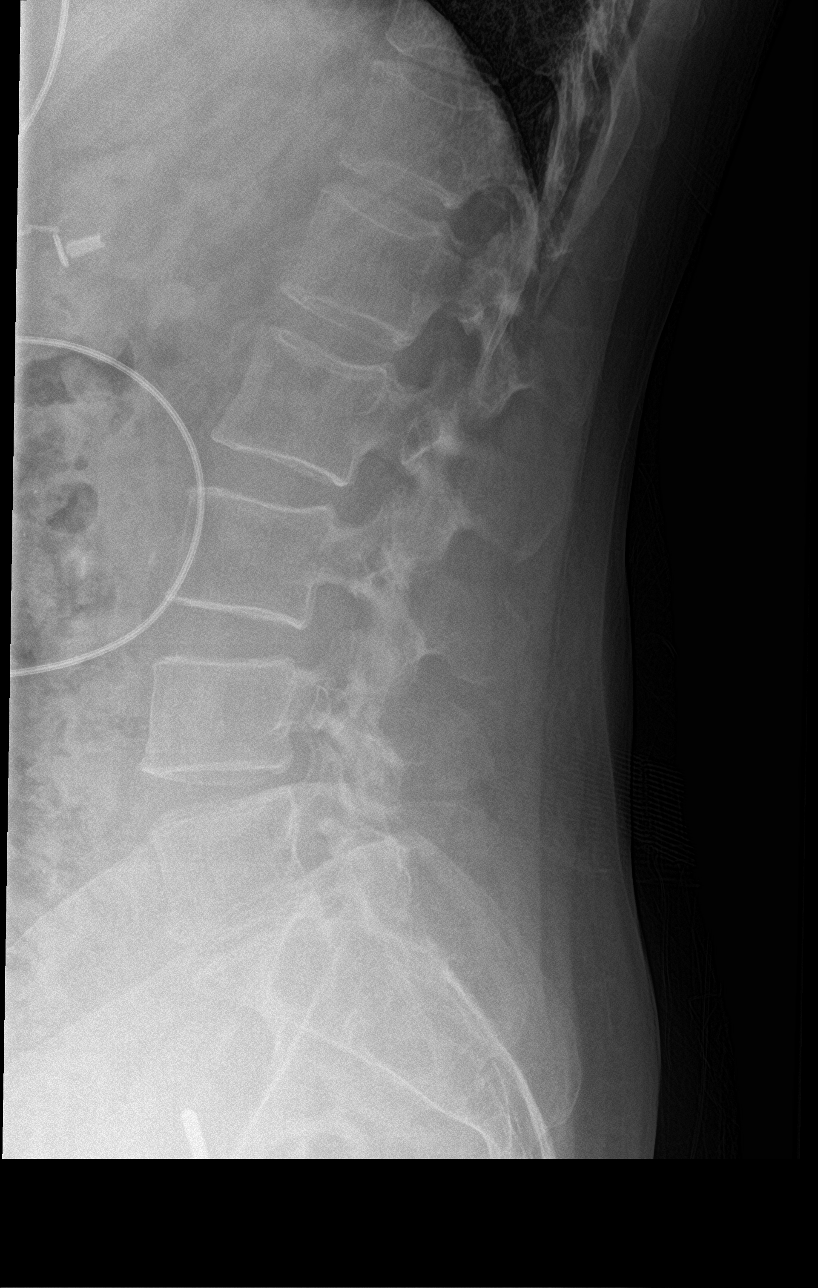

[l-spine spot]
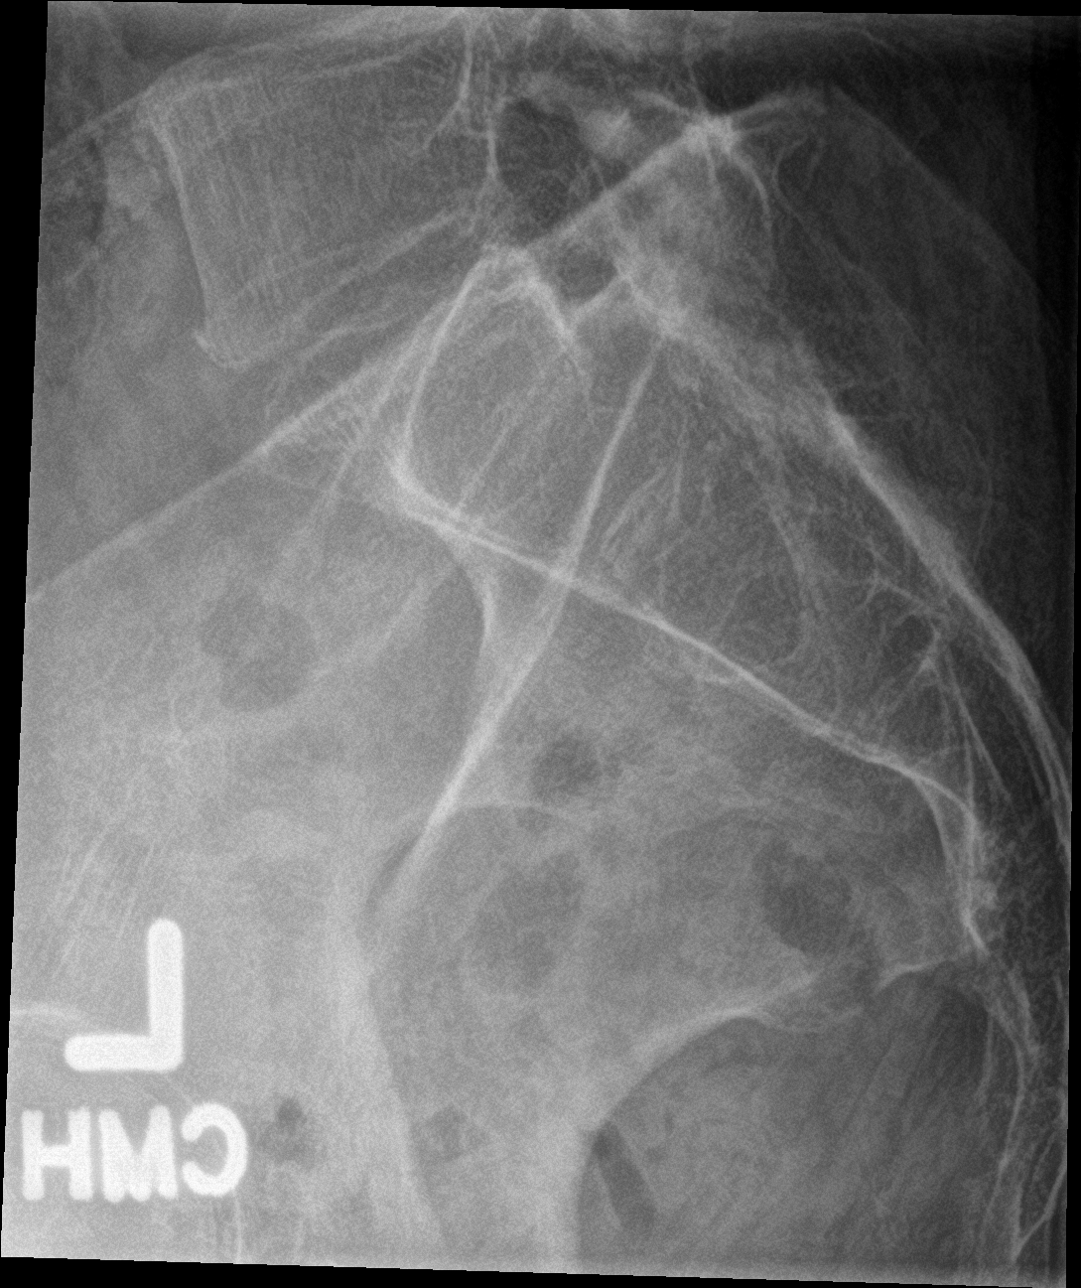

[5 of 5 positions shown; findings below may reference images not displayed]

FINDINGS: There is no evidence of lumbar spine fracture. Alignment is normal.
Mild to moderate disc space narrowing at L5-S1 with mild endplate
spurring. Mild L4-5 disc space narrowing. Possible generalized
osteopenia.
IMPRESSION: No acute finding.

L4-5 and L5-S1 disc degeneration, also seen in 3557 by MRI.

## 2021-11-25 ENCOUNTER — Telehealth: Payer: BC Managed Care – PPO | Admitting: Nurse Practitioner

## 2021-11-25 DIAGNOSIS — J0140 Acute pansinusitis, unspecified: Secondary | ICD-10-CM

## 2021-11-25 MED ORDER — AMOXICILLIN-POT CLAVULANATE 875-125 MG PO TABS: 1.0000 | ORAL_TABLET | Freq: Two times a day (BID) | ORAL | 0 refills | Status: AC

## 2021-11-25 NOTE — Progress Notes (Signed)
Virtual Visit Consent   Ashley [name], you are scheduled for a virtual visit with a [facility] provider today.     Just as with appointments in the office, your consent must be obtained to participate.  Your consent will be active for this visit and any virtual visit you may have with one of our providers in the next 365 days.     If you have a MyChart account, a copy of this consent can be sent to you electronically.  All virtual visits are billed to your insurance company just like a traditional visit in the office.    As this is a virtual visit, video technology does not allow for your provider to perform a traditional examination.  This may limit your provider's ability to fully assess your condition.  If your provider identifies any concerns that need to be evaluated in person or the need to arrange testing (such as labs, EKG, etc.), we will make arrangements to do so.     Although advances in technology are sophisticated, we cannot ensure that it will always work on either your end or our end.  If the connection with a video visit is poor, the visit may have to be switched to a telephone visit.  With either a video or telephone visit, we are not always able to ensure that we have a secure connection.     I need to obtain your verbal consent now.   Are you willing to proceed with your visit today?    Ashley [name] has provided verbal consent on 11/25/2021 for a virtual visit (video or telephone).   Apolonio Schneiders, FNP   Date: 11/25/2021 8:53 AM   Virtual Visit via Video Note   I, Apolonio Schneiders, connected with  Ashley [name]  ([id number], August 08, 1961) on 11/25/21 at  9:15 AM EST by a video-enabled telemedicine application and verified that I am speaking with the correct person using two identifiers.  Location: Patient: Virtual Visit Location Patient: Home Provider: Virtual Visit Location Provider: Home Office   I discussed the limitations of evaluation and management by telemedicine and  the availability of in person appointments. The patient expressed understanding and agreed to proceed.    History of Present Illness: Ashley [name] is a 61 y.o. who identifies as a female who was assigned female at birth, and is being seen today with complaints of ongoing nasal congestion and sinus pressure with darkened mucous over the past few days. She has not had a fever.   She had a GI virus last week as well and did have chills with that virus without a known fever.   She has had COVID in the past, most recently 10/2020  She has been managing her symptoms with sudafed and Mucinex Sinus & cold without relief.    Problems:  Patient Active Problem List   Diagnosis Date Noted   Encounter for screening fecal occult blood testing 04/04/2021   Encounter for well woman exam with routine gynecological exam 04/04/2021   Screening mammogram for breast cancer 04/04/2021   Muscle cramps 07/17/2020   Screening for colorectal cancer 10/08/2019   Encounter for gynecological examination with Papanicolaou smear of cervix 10/08/2019   Hypertension 08/25/2019   Gastroesophageal reflux disease without esophagitis 08/25/2019   Midline low back pain 08/25/2019   Tachycardia 08/25/2019   Special screening for malignant neoplasms, colon    Hypothyroidism 06/16/2015   Vaginal irritation 11/04/2014   Hematuria 11/04/2014   Hot flashes 11/04/2014  Hormone replacement therapy (HRT) 11/04/2014   PMB (postmenopausal bleeding) 02/15/2014   RUPTURE ROTATOR CUFF 03/09/2009   Pain in joint, shoulder region 06/23/2008   IMPINGEMENT SYNDROME 06/23/2008    Allergies:  Allergies  Allergen Reactions   Sulfa Antibiotics Hives and Itching    SORENESS OF MUSCLES   Macrobid [Nitrofurantoin Monohyd Macro] Rash   Medications:  Current Outpatient Medications:    atenolol (TENORMIN) 50 MG tablet, Take 1 tablet (50 mg total) by mouth daily., Disp: 90 tablet, Rfl: 3   calcium carbonate (OS-CAL) 600 MG TABS  tablet, Take 600 mg by mouth daily. , Disp: , Rfl:    esomeprazole (NEXIUM) 40 MG capsule, Take 1 capsule (40 mg total) by mouth daily., Disp: 30 capsule, Rfl: 0   esomeprazole (NEXIUM) 40 MG capsule, Take 1 capsule (40 mg total) by mouth daily., Disp: 90 capsule, Rfl: 1   levothyroxine (SYNTHROID) 137 MCG tablet, Take 1 tablet (137 mcg total) by mouth daily before breakfast., Disp: 30 tablet, Rfl: 3   Multiple Vitamin (MULTIVITAMIN) tablet, Take 1 tablet by mouth daily., Disp: , Rfl:    NAPROXEN PO, Take by mouth in the morning and at bedtime., Disp: , Rfl:    oxyCODONE-acetaminophen (PERCOCET/ROXICET) 5-325 MG tablet, Take 1 tablet by mouth every 6 (six) hours as needed for severe pain., Disp: 15 tablet, Rfl: 0   RESTASIS 0.05 % ophthalmic emulsion, , Disp: , Rfl:   Observations/Objective: Patient is well-developed, well-nourished in no acute distress.  Resting comfortably at home.  Head is normocephalic, atraumatic.  No labored breathing.  Speech is clear and coherent with logical content.  Patient is alert and oriented at baseline.    Assessment and Plan: 1. Acute non-recurrent pansinusitis  - amoxicillin-clavulanate (AUGMENTIN) 875-125 MG tablet; Take 1 tablet by mouth 2 (two) times daily for 7 days. Take with food  Dispense: 14 tablet; Refill: 0    Use  probiotic or yogurt with live cultures while using antibiotic.  Continue OTC decongestant as needed while congestion is present.  Follow Up Instructions: I discussed the assessment and treatment plan with the patient. The patient was provided an opportunity to ask questions and all were answered. The patient agreed with the plan and demonstrated an understanding of the instructions.  A copy of instructions were sent to the patient via MyChart unless otherwise noted below.    The patient was advised to call back or seek an in-person evaluation if the symptoms worsen or if the condition fails to improve as anticipated.  Time:  I  spent 10 minutes with the patient via telehealth technology discussing the above problems/concerns.    Apolonio Schneiders, FNP

## 2021-12-06 DIAGNOSIS — R220 Localized swelling, mass and lump, head: Secondary | ICD-10-CM | POA: Diagnosis not present

## 2021-12-06 DIAGNOSIS — K130 Diseases of lips: Secondary | ICD-10-CM | POA: Diagnosis not present

## 2022-01-08 DIAGNOSIS — I10 Essential (primary) hypertension: Secondary | ICD-10-CM | POA: Diagnosis not present

## 2022-01-08 DIAGNOSIS — E039 Hypothyroidism, unspecified: Secondary | ICD-10-CM | POA: Diagnosis not present

## 2022-01-15 DIAGNOSIS — G4700 Insomnia, unspecified: Secondary | ICD-10-CM | POA: Diagnosis not present

## 2022-01-15 DIAGNOSIS — R300 Dysuria: Secondary | ICD-10-CM | POA: Diagnosis not present

## 2022-01-15 DIAGNOSIS — I479 Paroxysmal tachycardia, unspecified: Secondary | ICD-10-CM | POA: Diagnosis not present

## 2022-01-15 DIAGNOSIS — R5383 Other fatigue: Secondary | ICD-10-CM | POA: Diagnosis not present

## 2022-01-15 DIAGNOSIS — E039 Hypothyroidism, unspecified: Secondary | ICD-10-CM | POA: Diagnosis not present

## 2022-01-15 DIAGNOSIS — R944 Abnormal results of kidney function studies: Secondary | ICD-10-CM | POA: Diagnosis not present

## 2022-01-15 DIAGNOSIS — R079 Chest pain, unspecified: Secondary | ICD-10-CM | POA: Diagnosis not present

## 2022-01-24 ENCOUNTER — Ambulatory Visit (INDEPENDENT_AMBULATORY_CARE_PROVIDER_SITE_OTHER): Payer: BC Managed Care – PPO | Admitting: Allergy & Immunology

## 2022-01-24 ENCOUNTER — Encounter: Payer: Self-pay | Admitting: Allergy & Immunology

## 2022-01-24 VITALS — BP 130/78 | HR 66 | Temp 97.1°F | Resp 18 | Ht 71.0 in | Wt 201.0 lb

## 2022-01-24 DIAGNOSIS — T783XXD Angioneurotic edema, subsequent encounter: Secondary | ICD-10-CM | POA: Diagnosis not present

## 2022-01-24 DIAGNOSIS — H5789 Other specified disorders of eye and adnexa: Secondary | ICD-10-CM

## 2022-01-24 DIAGNOSIS — T783XXA Angioneurotic edema, initial encounter: Secondary | ICD-10-CM

## 2022-01-24 NOTE — Progress Notes (Signed)
? ?NEW PATIENT ? ?Date of Service/Encounter:  01/24/22 ? ?Consult requested by: Benita Stabile, MD ? ? ?Assessment:  ? ?Angioedema of the face - unknown trigger ? ?Reportedly negative blood work for Sjogren's syndrome ? ?Periorbital swelling ? ?Hashimoto's thyroiditis - stable on thyroid hormone replacement therapy ? ?Plan/Recommendations:  ? ?1. Facial and periorbital angioedema ?- We are going to get some labs to rule out weird causes of swelling. ?- We will call you in 1-2 weeks with the results of the testing.  ?- I am somewhat confused by this picture, but I will keep thinking about it. ?- Testing to environmental and food allergies was negative.  ?- There is a the low positive predictive value of food allergy testing and hence the high possibility of false positives. ?- In contrast, food allergy testing has a high negative predictive value, therefore if testing is negative we can be relatively assured that they are indeed negative.  ? ?2. Chronic rhinitis ?- Testing was negative to the entire panel. ?- Consider stopping the Claritin (somewhat of a weaker antihistamine) and starting Zyrtec (cetirizine). ?- This might also help decrease the swelling that you are experiencing. ? ?3. Return in about 2 months (around 03/26/2022).  ? ? ?This note in its entirety was forwarded to the Provider who requested this consultation. ? ?Subjective:  ? ?Ashley [name] is a 61 y.o. female presenting today for evaluation of  ?Chief Complaint  ?Patient presents with  ? Angioedema  ?  Slight swelling in mouth, cheek caught between teeth when talking, swelling under mouth/ puffiness in eyes. Going on for About a couple years. Getting a bit worse in the past year eyes and mouth. Took antihistamine eye drops  ? ? ?Ashley [name] has a history of the following: ?Patient Active Problem List  ? Diagnosis Date Noted  ? Encounter for screening fecal occult blood testing 04/04/2021  ? Encounter for well woman exam with routine gynecological  exam 04/04/2021  ? Screening mammogram for breast cancer 04/04/2021  ? Muscle cramps 07/17/2020  ? Screening for colorectal cancer 10/08/2019  ? Encounter for gynecological examination with Papanicolaou smear of cervix 10/08/2019  ? Hypertension 08/25/2019  ? Gastroesophageal reflux disease without esophagitis 08/25/2019  ? Midline low back pain 08/25/2019  ? Tachycardia 08/25/2019  ? Special screening for malignant neoplasms, colon   ? Hypothyroidism 06/16/2015  ? Vaginal irritation 11/04/2014  ? Hematuria 11/04/2014  ? Hot flashes 11/04/2014  ? Hormone replacement therapy (HRT) 11/04/2014  ? PMB (postmenopausal bleeding) 02/15/2014  ? RUPTURE ROTATOR CUFF 03/09/2009  ? Pain in joint, shoulder region 06/23/2008  ? IMPINGEMENT SYNDROME 06/23/2008  ? ? ?History obtained from: chart review and patient. ? ?Ashley [name] was referred by Benita Stabile, MD.    ? ?Ashley [name] is a 61 y.o. female presenting for an evaluation of oral swelling and eye puffiness . She actually first noticed that she was having issues with saying certain words. This was a few years ago. Then she noticed that the cheek would get enlarged and go into a space in her teeth. She thinks that this is related to swelling of her cheeks intermittently. It would flare up and settle down. This is something more that she notices, but no one has ever noticed or said anything at all.  ? ?She has noted a swelling of eyes as well as of late. She started noticing this in the last few months. This was around the same time that she noticed  the swelling of the lips. The eyes itch but the lip never itches.  ? ?She has been on Claritin for 24 years or so. She previously lived in [place] area. She had no allergy symptoms at all when she lived there, although she did a lot of throat clearing. She has never been allergy tested at all. She does not get sinus infections. Symptoms do not seem to be seasonal.  ? ?Otherwise, there is no history of other atopic diseases,  including food allergies, stinging insect allergies, eczema, urticaria, or contact dermatitis. There is no significant infectious history. Vaccinations are up to date.  ? ? ?Past Medical History: ?Patient Active Problem List  ? Diagnosis Date Noted  ? Encounter for screening fecal occult blood testing 04/04/2021  ? Encounter for well woman exam with routine gynecological exam 04/04/2021  ? Screening mammogram for breast cancer 04/04/2021  ? Muscle cramps 07/17/2020  ? Screening for colorectal cancer 10/08/2019  ? Encounter for gynecological examination with Papanicolaou smear of cervix 10/08/2019  ? Hypertension 08/25/2019  ? Gastroesophageal reflux disease without esophagitis 08/25/2019  ? Midline low back pain 08/25/2019  ? Tachycardia 08/25/2019  ? Special screening for malignant neoplasms, colon   ? Hypothyroidism 06/16/2015  ? Vaginal irritation 11/04/2014  ? Hematuria 11/04/2014  ? Hot flashes 11/04/2014  ? Hormone replacement therapy (HRT) 11/04/2014  ? PMB (postmenopausal bleeding) 02/15/2014  ? RUPTURE ROTATOR CUFF 03/09/2009  ? Pain in joint, shoulder region 06/23/2008  ? IMPINGEMENT SYNDROME 06/23/2008  ? ? ?Medication List:  ?Allergies as of 01/24/2022   ? ?   Reactions  ? Sulfa Antibiotics Hives, Itching  ? SORENESS OF MUSCLES  ? Other Other (See Comments)  ? Macrobid [nitrofurantoin Monohyd Macro] Rash  ? ?  ? ?  ?Medication List  ?  ? ?  ? Accurate as of January 24, 2022 11:39 PM. If you have any questions, ask your nurse or doctor.  ?  ?  ? ?  ? ?atenolol 50 MG tablet ?Commonly known as: TENORMIN ?Take 1 tablet (50 mg total) by mouth daily. ?  ?calcium carbonate 600 MG Tabs tablet ?Commonly known as: OS-CAL ?Take 600 mg by mouth daily. ?  ?calcium-vitamin D 500-5 MG-MCG tablet ?Commonly known as: OSCAL WITH D ?Take 1 tablet by mouth daily with breakfast. ?  ?esomeprazole 40 MG capsule ?Commonly known as: NexIUM ?Take 1 capsule (40 mg total) by mouth daily. ?  ?esomeprazole 40 MG capsule ?Commonly known  as: NexIUM ?Take 1 capsule (40 mg total) by mouth daily. ?  ?fluticasone 50 MCG/ACT nasal spray ?Commonly known as: FLONASE ?Place 2 sprays into both nostrils daily as needed for allergies or rhinitis. ?  ?ketotifen 0.025 % ophthalmic solution ?Commonly known as: ZADITOR ?1 drop 2 (two) times daily. ?  ?levothyroxine 137 MCG tablet ?Commonly known as: SYNTHROID ?Take 1 tablet (137 mcg total) by mouth daily before breakfast. ?  ?multivitamin tablet ?Take 1 tablet by mouth daily. ?  ?NAPROXEN PO ?Take by mouth in the morning and at bedtime. ?  ?oxyCODONE-acetaminophen 5-325 MG tablet ?Commonly known as: PERCOCET/ROXICET ?Take 1 tablet by mouth every 6 (six) hours as needed for severe pain. ?  ?Restasis 0.05 % ophthalmic emulsion ?Generic drug: cycloSPORINE ?  ?Tyrvaya 0.03 MG/ACT Soln ?Generic drug: Varenicline Tartrate ?Place 1 spray into the nose daily. ?  ?zolpidem 10 MG tablet ?Commonly known as: AMBIEN ?Take 10 mg by mouth daily. ?  ? ?  ? ? ?Birth History: non-contributory ? ?Developmental  History: non-contributory ? ?Past Surgical History: ?Past Surgical History:  ?Procedure Laterality Date  ? APPENDECTOMY  1966  ? CHOLECYSTECTOMY  04/18/2007  ? CHONDROPLASTY Right 08/11/2015  ? Procedure: CHONDROPLASTY;  Surgeon: Loreta Aveaniel F Murphy, MD;  Location: [facility];  Service: Orthopedics;  Laterality: Right;  ? COLONOSCOPY N/A 07/15/2015  ? Procedure: COLONOSCOPY;  Surgeon: West BaliSandi L Fields, MD;  Location[facility];  Service: Endoscopy;  Laterality: N/A;  10:45 AM  ? ENDOMETRIAL ABLATION  03/14/2011  ? HYSTEROSCOPY WITH D & C  03/14/2011  ? KNEE ARTHROSCOPY WITH EXCISION PLICA Right 08/11/2015  ? Procedure: KNEE ARTHROSCOPY WITH EXCISION PLICA;  Surgeon: Loreta Aveaniel F Murphy, MD;  Location: [facility];  Service: Orthopedics;  Laterality: Right;  ? SHOULDER ARTHROSCOPY Right   ? SPINE SURGERY    ? ? ? ?Family History: ?Family History  ?Problem Relation Age of Onset  ? Alcohol abuse Paternal  Grandfather   ? Cancer Maternal Grandmother   ?     breast,eye  ? Heart disease Maternal Grandmother   ? Heart attack Maternal Grandfather   ? Hypertension Father   ? Parkinson's disease Father   ? Thyroid diseas

## 2022-01-24 NOTE — Patient Instructions (Addendum)
1. Facial and periorbital angioedema ?- We are going to get some labs to rule out weird causes of swelling. ?- We will call you in 1-2 weeks with the results of the testing.  ?- I am somewhat confused by this picture, but I will keep thinking about it. ?- Testing to environmental and food allergies was negative.  ?- There is a the low positive predictive value of food allergy testing and hence the high possibility of false positives. ?- In contrast, food allergy testing has a high negative predictive value, therefore if testing is negative we can be relatively assured that they are indeed negative.  ? ?2. Chronic rhinitis ?- Testing was negative to the entire panel. ?- Consider stopping the Claritin (somewhat of a weaker antihistamine) and starting Zyrtec (cetirizine). ?- This might also help decrease the swelling that you are experiencing. ? ?3. Return in about 2 months (around 03/26/2022).  ? ? ?Please inform us of any Emergency Department visits, hospitalizations, or changes in symptoms. Call us before going to the ED for breathing or allergy symptoms since we might be able to fit you in for a sick visit. Feel free to contact us anytime with any questions, problems, or concerns. ? ?It was a pleasure to meet you today! ? ?Websites that have reliable patient information: ?1. American Academy of Asthma, Allergy, and Immunology: www.aaaai.org ?2. Food Allergy Research and Education (FARE): foodallergy.org ?3. Mothers of Asthmatics: http://www.asthmacommunitynetwork.org ?4. [employer] of Allergy, Asthma, and Immunology: MissingWeapons.ca ? ? ?COVID-19 Vaccine Information can be found at: PodExchange.nl For questions related to vaccine distribution or appointments, please email [email or web address] or call [phone].  ? ?We realize that you might be concerned about having an allergic reaction to the COVID19 vaccines. To help with that concern, WE ARE  OFFERING THE COVID19 VACCINES IN OUR OFFICE! Ask the front desk for dates!  ? ? ? ??Like? [place] on Facebook and Instagram for our latest updates!  ?  ? ? ?A healthy democracy works best when [employer] participate! Make sure you are registered to vote! If you have moved or changed any of your contact information, you will need to get this updated before voting! ? ?In some cases, you MAY be able to register to vote online: AromatherapyCrystals.be ? ? ? ? ? ? Airborne Adult Perc - 01/24/22 1052   ? ? Time Antigen Placed [phone]   ? Allergen Manufacturer Waynette Buttery   ? Location Back   ? Number of Test 59   ? 1. Control-Buffer 50% Glycerol Negative   ? 2. Control-Histamine 1 mg/ml 2+   ? 3. Albumin saline Negative   ? 4. Bahia Negative   ? 5. [place] Negative   ? 6. Johnson Negative   ? 7. [place] Blue Negative   ? 8. Meadow Fescue Negative   ? 9. Perennial Rye Negative   ? 10. Sweet Vernal Negative   ? 11. Timothy Negative   ? 12. Cocklebur Negative   ? 13. Burweed Marshelder Negative   ? 14. Ragweed, short Negative   ? 15. Ragweed, Giant Negative   ? 16. Plantain,  English Negative   ? 17. Lamb's Quarters Negative   ? 18. Sheep Sorrell Negative   ? 19. Rough Pigweed Negative   ? 20. Marsh Elder, Rough Negative   ? 21. Mugwort, Common Negative   ? 22. Ash mix Negative   ? 23. Charletta Cousin mix Negative   ? 24. Beech American Negative   ? 25. Box, Elder Negative   ?  26. Cedar, red Negative   ? 27. Cottonwood, Guinea[place]issau Negative   ? 28. Elm mix Negative   ? 29. Hickory Negative   ? 30. Maple mix Negative   ? 31. Oak, Guinea[place]issau mix Negative   ? 32. Pecan Pollen Negative   ? 33. Pine mix Negative   ? 34. Sycamore Eastern Negative   ? 35. Walnut, Black Pollen Negative   ? 36. Alternaria alternata Negative   ? 37. Cladosporium Herbarum Negative   ? 38. Aspergillus mix Negative   ? 39. Penicillium mix Negative   ? 40. Bipolaris sorokiniana (Helminthosporium) Negative   ? 41. Drechslera spicifera (Curvularia) Negative   ?  42. Mucor plumbeus Negative   ? 43. Fusarium moniliforme Negative   ? 44. Aureobasidium pullulans (pullulara) Negative   ? 45. Rhizopus oryzae Negative   ? 46. Botrytis cinera Negative   ? 47. Epicoccum nigrum Negative   ? 48. Phoma betae Negative   ? 49. Candida Albicans Negative   ? 50. Trichophyton mentagrophytes Negative   ? 51. Mite, D Farinae  5,000 AU/ml Negative   ? 52. Mite, D Pteronyssinus  5,000 AU/ml Negative   ? 53. Cat Hair 10,000 BAU/ml Negative   ? 54.  Dog Epithelia Negative   ? 55. Mixed Feathers Negative   ? 56. Horse Epithelia Negative   ? 57. Cockroach, [place] Negative   ? 58. Mouse Negative   ? 59. Tobacco Leaf Negative   ? ?  ?  ? ?  ? ? Food Perc - 01/24/22 1052   ? ?  ? Test Information  ? Time Antigen Placed [phone]   ? Allergen Manufacturer Waynette Buttery   ? Location Back   ? Number of allergen test 10   ? Food Select   ?  ? Food  ? 1. Peanut Negative   ? 2. Soybean food Negative   ? 3. Wheat, whole Negative   ? 4. Sesame Negative   ? 5. Milk, cow Negative   ? 6. Egg White, chicken Negative   ? 7. Casein Negative   ? 8. Shellfish mix Negative   ? 9. Fish mix Negative   ? 10. Cashew Negative   ? ?  ?  ? ?  ? ? ? ? ? ? ?

## 2022-01-31 LAB — C3 AND C4
Complement C3, Serum: 127 mg/dL (ref 82–167)
Complement C4, Serum: 21 mg/dL (ref 12–38)

## 2022-01-31 LAB — TRYPTASE: Tryptase: 6.1 ug/L (ref 2.2–13.2)

## 2022-01-31 LAB — C1 ESTERASE INHIBITOR, FUNCTIONAL: C1INH Functional/C1INH Total MFr SerPl: >93 %mean normal

## 2022-01-31 LAB — COMPLEMENT COMPONENT C1Q: Complement C1Q: 12.8 mg/dL (ref 10.3–20.5)

## 2022-01-31 LAB — C1 ESTERASE INHIBITOR: C1INH SerPl-mCnc: 28 mg/dL (ref 21–39)

## 2022-02-19 ENCOUNTER — Telehealth: Payer: BC Managed Care – PPO | Admitting: Emergency Medicine

## 2022-02-19 DIAGNOSIS — W19XXXA Unspecified fall, initial encounter: Secondary | ICD-10-CM

## 2022-02-19 NOTE — Progress Notes (Signed)
After explaining limits of virtual video visit, pt elected not to proceed with appt.

## 2022-02-20 ENCOUNTER — Ambulatory Visit (HOSPITAL_COMMUNITY)
Admission: RE | Admit: 2022-02-20 | Discharge: 2022-02-20 | Disposition: A | Discharge status: Home / Self Care | Payer: BC Managed Care – PPO | Source: Ambulatory Visit | Attending: Nurse Practitioner | Admitting: Nurse Practitioner

## 2022-02-20 ENCOUNTER — Other Ambulatory Visit: Payer: Self-pay | Admitting: Nurse Practitioner

## 2022-02-20 ENCOUNTER — Other Ambulatory Visit (HOSPITAL_COMMUNITY): Payer: Self-pay | Admitting: Nurse Practitioner

## 2022-02-20 DIAGNOSIS — W19XXXA Unspecified fall, initial encounter: Secondary | ICD-10-CM | POA: Diagnosis not present

## 2022-02-20 DIAGNOSIS — R0781 Pleurodynia: Secondary | ICD-10-CM | POA: Diagnosis not present

## 2022-02-20 DIAGNOSIS — R5383 Other fatigue: Secondary | ICD-10-CM | POA: Diagnosis not present

## 2022-02-20 DIAGNOSIS — R070 Pain in throat: Secondary | ICD-10-CM

## 2022-02-20 DIAGNOSIS — R053 Chronic cough: Secondary | ICD-10-CM | POA: Diagnosis not present

## 2022-02-20 DIAGNOSIS — R202 Paresthesia of skin: Secondary | ICD-10-CM | POA: Diagnosis not present

## 2022-02-25 ENCOUNTER — Encounter: Payer: Self-pay | Admitting: Nurse Practitioner

## 2022-02-25 ENCOUNTER — Telehealth: Payer: BC Managed Care – PPO | Admitting: Nurse Practitioner

## 2022-02-28 ENCOUNTER — Ambulatory Visit (HOSPITAL_COMMUNITY)
Admission: RE | Admit: 2022-02-28 | Discharge: 2022-02-28 | Disposition: A | Discharge status: Home / Self Care | Payer: BC Managed Care – PPO | Source: Ambulatory Visit | Attending: Nurse Practitioner | Admitting: Nurse Practitioner

## 2022-02-28 DIAGNOSIS — R070 Pain in throat: Secondary | ICD-10-CM | POA: Diagnosis not present

## 2022-02-28 DIAGNOSIS — M542 Cervicalgia: Secondary | ICD-10-CM | POA: Diagnosis not present

## 2022-03-07 DIAGNOSIS — R599 Enlarged lymph nodes, unspecified: Secondary | ICD-10-CM | POA: Diagnosis not present

## 2022-03-07 DIAGNOSIS — J029 Acute pharyngitis, unspecified: Secondary | ICD-10-CM | POA: Diagnosis not present

## 2022-03-07 DIAGNOSIS — B370 Candidal stomatitis: Secondary | ICD-10-CM | POA: Diagnosis not present

## 2022-03-07 DIAGNOSIS — R220 Localized swelling, mass and lump, head: Secondary | ICD-10-CM | POA: Diagnosis not present

## 2022-03-29 DIAGNOSIS — K146 Glossodynia: Secondary | ICD-10-CM | POA: Diagnosis not present

## 2022-04-04 ENCOUNTER — Ambulatory Visit: Payer: BC Managed Care – PPO | Admitting: Family

## 2022-04-20 ENCOUNTER — Other Ambulatory Visit: Payer: Self-pay

## 2022-04-20 ENCOUNTER — Encounter: Payer: Self-pay | Admitting: Family Medicine

## 2022-04-20 ENCOUNTER — Ambulatory Visit: Payer: BC Managed Care – PPO | Admitting: Family Medicine

## 2022-04-20 VITALS — BP 104/72 | HR 65 | Resp 16

## 2022-04-20 DIAGNOSIS — T783XXD Angioneurotic edema, subsequent encounter: Secondary | ICD-10-CM

## 2022-04-20 DIAGNOSIS — J310 Chronic rhinitis: Secondary | ICD-10-CM | POA: Diagnosis not present

## 2022-04-20 DIAGNOSIS — H1013 Acute atopic conjunctivitis, bilateral: Secondary | ICD-10-CM

## 2022-04-20 DIAGNOSIS — L501 Idiopathic urticaria: Secondary | ICD-10-CM | POA: Diagnosis not present

## 2022-04-20 DIAGNOSIS — H1010 Acute atopic conjunctivitis, unspecified eye: Secondary | ICD-10-CM

## 2022-04-20 NOTE — Progress Notes (Unsigned)
   [address] [place] [address] Dept: [phone]  FOLLOW UP NOTE  Patient ID: Ashley [name], female    DOB: November 29, 1960  Age: 61 y.o. MRN: [id number] Date of Office Visit: 04/20/2022  Assessment  Chief Complaint: No chief complaint on file.  HPI Ashley [name] is a 61 year old female who presents to the clinic for follow-up visit.  She was last seen in this clinic on 01/24/2022 by Dr. Dellis Anes for evaluation of chronic rhinitis, angioedema, and history of Hashimoto thyroiditis.  On 01/24/2022 she had negative testing to the adult environmental as well as negative blood work to the HAE panel.   Drug Allergies:  Allergies  Allergen Reactions   Sulfa Antibiotics Hives and Itching    SORENESS OF MUSCLES   Other Other (See Comments)   Macrobid [Nitrofurantoin Monohyd Macro] Rash    Physical Exam: There were no vitals taken for this visit.   Physical Exam  Diagnostics:    Assessment and Plan: No diagnosis found.  No orders of the defined types were placed in this encounter.   There are no Patient Instructions on file for this visit.  No follow-ups on file.    Thank you for the opportunity to care for this patient.  Please do not hesitate to contact me with questions.  Thermon Leyland, FNP [facility]

## 2022-04-20 NOTE — Patient Instructions (Addendum)
Angioedema/Hives Decrease Claritin to 10 mg once a day as needed to control swelling If your symptoms re-occur, begin a journal of events that occurred for up to 6 hours before your symptoms began including foods and beverages consumed, soaps or perfumes you had contact with, and medications.  We will submit your information to your insurance company regarding Xolair.  You will next hear from our Biologics coordinator, Tammy, about next steps for Xolair.  Written information provided about Xolair.  Chronic rhinitis Continue Claritin 10 mg once a day as needed for runny nose or itch Begin Ryaltris 2 sprays in each nostril up to twice a day as needed for nasal congestion  Consider saline nasal rinses as needed for nasal symptoms. Use this before any medicated nasal sprays for best result  Allergic conjunctivitis Continue a moisturizing eyedrop as needed Some over the counter eye drops include Pataday one drop in each eye once a day as needed for red, itchy eyes OR Zaditor one drop in each eye twice a day as needed for red itchy eyes.  Call the clinic if this treatment plan is not working well for you.  Follow up in 3 months or sooner if needed.

## 2022-04-22 DIAGNOSIS — H5789 Other specified disorders of eye and adnexa: Secondary | ICD-10-CM | POA: Insufficient documentation

## 2022-04-22 DIAGNOSIS — J310 Chronic rhinitis: Secondary | ICD-10-CM | POA: Insufficient documentation

## 2022-04-22 DIAGNOSIS — T783XXA Angioneurotic edema, initial encounter: Secondary | ICD-10-CM | POA: Insufficient documentation

## 2022-04-22 DIAGNOSIS — L501 Idiopathic urticaria: Secondary | ICD-10-CM | POA: Insufficient documentation

## 2022-04-22 DIAGNOSIS — H1010 Acute atopic conjunctivitis, unspecified eye: Secondary | ICD-10-CM | POA: Insufficient documentation

## 2022-04-23 ENCOUNTER — Telehealth: Payer: Self-pay | Admitting: *Deleted

## 2022-04-23 NOTE — Telephone Encounter (Signed)
L/m for patient to contact me to advise approval, copay card and submit to [facility] for Xolair for urticaria

## 2022-04-23 NOTE — Telephone Encounter (Signed)
-----   Message from Hetty Blend, FNP sent at 04/20/2022  5:17 PM EDT ----- Hi Ashley [name], Can you please try to submit this patient for Xolair for chronic urticaria? Thank you

## 2022-04-24 ENCOUNTER — Telehealth: Payer: Self-pay | Admitting: Allergy & Immunology

## 2022-04-24 NOTE — Telephone Encounter (Signed)
Patient is using ryaltris at the moment, patient states she is tired and her eyes are bothering her. Patient is wanting to know if these side effects will subside if she continues with the ryaltris?   Please advise   Best contact number: [phone]

## 2022-04-24 NOTE — Telephone Encounter (Signed)
It CAN help with the eyes, but it has more effect on the nasal symptoms. We can add on an eye drop. I pended Bepreve eye drops. Can you confirm a pharmacy and sign on my behalf?   Malachi Bonds, MD [facility]

## 2022-04-24 NOTE — Telephone Encounter (Signed)
Spoken to patient and notified Dr Ellouise Newer comments. Verbalized understanding. Patient wanted to hold off on adding the eye drops. She wants to wait a week and see if the dryness will change. She will let us know.

## 2022-04-25 NOTE — Telephone Encounter (Signed)
That sounds great.  Thank you

## 2022-04-25 NOTE — Telephone Encounter (Signed)
Spoke to patient and advised approval and submit to [facility] will reach regarding star of therapy once delivery set

## 2022-05-03 ENCOUNTER — Telehealth: Payer: Self-pay | Admitting: *Deleted

## 2022-05-03 NOTE — Telephone Encounter (Signed)
L/m delivery of Xolair 8/8 and can call clinic to make appt to start therapy

## 2022-05-07 MED ORDER — EPINEPHRINE 0.3 MG/0.3ML IJ SOAJ: 0.3000 mg | INTRAMUSCULAR | 1 refills | Status: AC | PRN

## 2022-05-07 NOTE — Addendum Note (Signed)
Addended by: Devoria Glassing on: 05/07/2022 04:05 PM   Modules accepted: Orders

## 2022-05-07 NOTE — Telephone Encounter (Signed)
Patient is scheduled to start Xolair in [facility] on 05/11/22. She requested a EPI Pen be sent to [facility].   Thanks

## 2022-05-11 ENCOUNTER — Ambulatory Visit (INDEPENDENT_AMBULATORY_CARE_PROVIDER_SITE_OTHER): Payer: BC Managed Care – PPO

## 2022-05-11 DIAGNOSIS — L501 Idiopathic urticaria: Secondary | ICD-10-CM

## 2022-05-11 MED ORDER — OMALIZUMAB 150 MG/ML ~~LOC~~ SOSY
300.0000 mg | PREFILLED_SYRINGE | SUBCUTANEOUS | Status: AC
  Administered 2022-05-11 – 2022-06-11 (×2): 300 mg via SUBCUTANEOUS

## 2022-05-11 NOTE — Progress Notes (Signed)
Immunotherapy   Patient Details  Name: DOREATHA OFFER MRN: [id number] Date of Birth: Oct 07, 1960  05/11/2022  Dennis Bast started injections for  Xolair Following schedule: Every twenty eight days.   Frequency: Every Four weeks.  Epi-Pen:Epi-Pen Available  Consent signed and patient instructions given. Patient was instructed on the protocol as well as schedule she soul adhere to. Patient waited in the lobby for thirty minutes without an issue.    Ralene Muskrat 05/11/2022, 3:13 PM

## 2022-05-24 DIAGNOSIS — H16223 Keratoconjunctivitis sicca, not specified as Sjogren's, bilateral: Secondary | ICD-10-CM | POA: Diagnosis not present

## 2022-05-30 ENCOUNTER — Telehealth: Payer: Self-pay

## 2022-05-30 NOTE — Telephone Encounter (Signed)
Patient was calling regarding her bill she received for $118.00. She is requesting a call back from billing to discuss.  Thanks

## 2022-06-11 ENCOUNTER — Ambulatory Visit (INDEPENDENT_AMBULATORY_CARE_PROVIDER_SITE_OTHER): Payer: BC Managed Care – PPO | Admitting: *Deleted

## 2022-06-11 DIAGNOSIS — L501 Idiopathic urticaria: Secondary | ICD-10-CM

## 2022-06-11 NOTE — Progress Notes (Signed)
Immunotherapy   Patient Details  Name: FADUMO HENG MRN: [id number] Date of Birth: [birth year]  06/11/2022  Dennis Bast  Patient self administerd 300mg  of Xolair in her Left Umbilicus and Right Umbilicus. She will continue to self administer at home every 4 weeks.    Kamryn Gauthier Fernandez-Vernon 06/11/2022, 9:06 AM

## 2022-07-05 DIAGNOSIS — H1013 Acute atopic conjunctivitis, bilateral: Secondary | ICD-10-CM | POA: Diagnosis not present

## 2022-07-12 DIAGNOSIS — E782 Mixed hyperlipidemia: Secondary | ICD-10-CM | POA: Diagnosis not present

## 2022-07-12 DIAGNOSIS — E0590 Thyrotoxicosis, unspecified without thyrotoxic crisis or storm: Secondary | ICD-10-CM | POA: Diagnosis not present

## 2022-07-17 DIAGNOSIS — G4700 Insomnia, unspecified: Secondary | ICD-10-CM | POA: Diagnosis not present

## 2022-07-17 DIAGNOSIS — E039 Hypothyroidism, unspecified: Secondary | ICD-10-CM | POA: Diagnosis not present

## 2022-07-17 DIAGNOSIS — Z23 Encounter for immunization: Secondary | ICD-10-CM | POA: Diagnosis not present

## 2022-07-17 DIAGNOSIS — I479 Paroxysmal tachycardia, unspecified: Secondary | ICD-10-CM | POA: Diagnosis not present

## 2022-07-18 ENCOUNTER — Encounter: Payer: Self-pay | Admitting: Allergy & Immunology

## 2022-07-18 ENCOUNTER — Ambulatory Visit: Payer: BC Managed Care – PPO | Admitting: Allergy & Immunology

## 2022-07-18 ENCOUNTER — Other Ambulatory Visit: Payer: Self-pay

## 2022-07-18 VITALS — BP 122/80 | HR 68 | Temp 97.3°F | Resp 18 | Ht 71.0 in | Wt 200.0 lb

## 2022-07-18 DIAGNOSIS — L501 Idiopathic urticaria: Secondary | ICD-10-CM | POA: Diagnosis not present

## 2022-07-18 DIAGNOSIS — H5789 Other specified disorders of eye and adnexa: Secondary | ICD-10-CM

## 2022-07-18 DIAGNOSIS — T783XXD Angioneurotic edema, subsequent encounter: Secondary | ICD-10-CM

## 2022-07-18 DIAGNOSIS — H1013 Acute atopic conjunctivitis, bilateral: Secondary | ICD-10-CM

## 2022-07-18 DIAGNOSIS — H1010 Acute atopic conjunctivitis, unspecified eye: Secondary | ICD-10-CM

## 2022-07-18 MED ORDER — FLUTICASONE PROPIONATE 50 MCG/ACT NA SUSP: 2.0000 | Freq: Every day | NASAL | 5 refills | Status: DC | PRN

## 2022-07-18 NOTE — Progress Notes (Signed)
FOLLOW UP  Date of Service/Encounter:  07/18/22   Assessment:   Angioedema of the face - unknown trigger   Reportedly negative blood work for Sjogren's syndrome   Periorbital swelling   Hashimoto's thyroiditis - stable on thyroid hormone replacement therapy  History of elevated ANA   Numbness of the left leg   Plan/Recommendations:    There are no Patient Instructions on file for this visit.   Subjective:   Ashley [name] is a 61 y.o. female presenting today for follow up of  Chief Complaint  Patient presents with  . Angioedema    Still having issues with swelling not sure if the xoliar is helping - ( not as much issues with itching )  . Urticaria    No issues   . Allergic Rhinitis     Plans to restart the Flonase daily - has been having dry eye issues      Ashley [name] has a history of the following: Patient Active Problem List   Diagnosis Date Noted  . Angio-edema 04/22/2022  . Periorbital swelling 04/22/2022  . Idiopathic urticaria 04/22/2022  . Seasonal allergic conjunctivitis 04/22/2022  . Chronic rhinitis 04/22/2022  . Encounter for screening fecal occult blood testing 04/04/2021  . Encounter for well woman exam with routine gynecological exam 04/04/2021  . Screening mammogram for breast cancer 04/04/2021  . Muscle cramps 07/17/2020  . Screening for colorectal cancer 10/08/2019  . Encounter for gynecological examination with Papanicolaou smear of cervix 10/08/2019  . Hypertension 08/25/2019  . Gastroesophageal reflux disease without esophagitis 08/25/2019  . Midline low back pain 08/25/2019  . Tachycardia 08/25/2019  . Special screening for malignant neoplasms, colon   . Hypothyroidism 06/16/2015  . Vaginal irritation 11/04/2014  . Hematuria 11/04/2014  . Hot flashes 11/04/2014  . Hormone replacement therapy (HRT) 11/04/2014  . PMB (postmenopausal bleeding) 02/15/2014  . RUPTURE ROTATOR CUFF 03/09/2009  . Pain in joint, shoulder region  06/23/2008  . IMPINGEMENT SYNDROME 06/23/2008    History obtained from: chart review and {Persons; PED relatives w/patient:19415::"patient"}.  Ashley [name] is a 61 y.o. female presenting for {Blank single:19197::"a food challenge","a drug challenge","skin testing","a sick visit","an evaluation of ***","a follow up visit"}.  She was last seen in July 2023.  At that time, we decreased her Claritin to 10 mg daily.  We did submit her for Xolair approval.  For her rhinitis, we continue with Claritin as well has nasal saline rinses.  We added on Ryaltris 2 sprays per nostril up to twice daily.  Since the last visit, she has done "OK". Her itching and hives are improved, but she cannot tell the different with being on the Xolair. She has gotten two injections in the office an one at home a couple of weeks ago. It is unclear whether this is working at all.   She has noticed decreased itching of her eyes. But when she adds the makeup in the morning, she still has the swelling of her eyes. This is mostly on the right tside but also on the left side. She does report some swelling and puffiness of her eye lids. She also has some on her lips. It also feels a bit swollen.   She is wondering whether there is a correlation with her Restasis drops. She stared a new eye drop for several years ago in place of the Restasis. She has tried changing mascara and she has tried getting hypoallergenic make ups. She has changed eye liners. This is more than  just the eyes since it involves the mouth as well.   She has seen Rheumatology in the past. This was somewhere in [place].   {Blank single:19197::"Asthma/Respiratory Symptom History: ***"," "}  {Blank single:19197::"Allergic Rhinitis Symptom History: ***"," "}  {Blank single:19197::"Food Allergy Symptom History: ***"," "}  {Blank single:19197::"Skin Symptom History: ***"," "}  {Blank single:19197::"GERD Symptom History: ***"," "}  Otherwise, there have been no changes to her  past medical history, surgical history, family history, or social history.    ROS     Objective:   Blood pressure 122/80, pulse 68, temperature (!) 97.3 F (36.3 C), resp. rate 18, height 5\' 11"  (1.803 m), weight 200 lb (90.7 kg), SpO2 97 %. Body mass index is 27.89 kg/m.    Physical Exam   Diagnostic studies: {Blank single:19197::"none","deferred due to recent antihistamine use","labs sent instead"," "}  Spirometry: {Blank single:19197::"results normal (FEV1: ***%, FVC: ***%, FEV1/FVC: ***%)","results abnormal (FEV1: ***%, FVC: ***%, FEV1/FVC: ***%)"}.    {Blank single:19197::"Spirometry consistent with mild obstructive disease","Spirometry consistent with moderate obstructive disease","Spirometry consistent with severe obstructive disease","Spirometry consistent with possible restrictive disease","Spirometry consistent with mixed obstructive and restrictive disease","Spirometry uninterpretable due to technique","Spirometry consistent with normal pattern"}. {Blank single:19197::"Albuterol/Atrovent nebulizer","Xopenex/Atrovent nebulizer","Albuterol nebulizer","Albuterol four puffs via MDI","Xopenex four puffs via MDI"} treatment given in clinic with {Blank single:19197::"significant improvement in FEV1 per ATS criteria","significant improvement in FVC per ATS criteria","significant improvement in FEV1 and FVC per ATS criteria","improvement in FEV1, but not significant per ATS criteria","improvement in FVC, but not significant per ATS criteria","improvement in FEV1 and FVC, but not significant per ATS criteria","no improvement"}.  Allergy Studies: {Blank single:19197::"none","labs sent instead"," "}    {Blank single:19197::"Allergy testing results were read and interpreted by myself, documented by clinical staff."," "}      Salvatore Marvel, MD  [facility]

## 2022-07-18 NOTE — Patient Instructions (Addendum)
Angioedema/Hives - complicated the possible contact dermatitis (reaction to different chemicals/cosmetics) - Continue with Claritin to 10 mg once a day as needed to control swelling. - Continue with Xolair for now, but we may consider stopping in the next month or so.  - We are going to get some lab work today (we previously did some workup for swelling alone, but with the hives I think we need to do some more testing). - We are going to do patch testing in the next few weeks.   2 Chronic rhinitis - Continue Claritin 10 mg once a day as needed for runny nose or itch - Continue Ryaltris 2 sprays in each nostril up to twice a day as needed for nasal congestion  - Consider saline nasal rinses as needed for nasal symptoms. Use this before any medicated nasal sprays for best result  3. Return in about 4 weeks (around 08/15/2022) for [facility] TESTING.    Please inform us of any Emergency Department visits, hospitalizations, or changes in symptoms. Call us before going to the ED for breathing or allergy symptoms since we might be able to fit you in for a sick visit. Feel free to contact us anytime with any questions, problems, or concerns.  It was a pleasure to see you again today!  Websites that have reliable patient information: 1. American Academy of Asthma, Allergy, and Immunology: www.aaaai.org 2. Food Allergy Research and Education (FARE): foodallergy.org 3. Mothers of Asthmatics: http://www.asthmacommunitynetwork.org 4. American College of Allergy, Asthma, and Immunology: www.acaai.org   COVID-19 Vaccine Information can be found at: ShippingScam.co.uk For questions related to vaccine distribution or appointments, please email vaccine@[email or web address] .com or call [phone].   We realize that you might be concerned about having an allergic reaction to the COVID19 vaccines. To help with that concern, WE ARE OFFERING THE COVID19 VACCINES IN  OUR OFFICE! Ask the front desk for dates!     "Like" [place] on Facebook and Instagram for our latest updates!      A healthy democracy works best when [employer] participate! Make sure you are registered to vote! If you have moved or changed any of your contact information, you will need to get this updated before voting!  In some cases, you MAY be able to register to vote online: [email or web address]

## 2022-07-19 ENCOUNTER — Encounter: Payer: Self-pay | Admitting: Allergy & Immunology

## 2022-07-24 DIAGNOSIS — R21 Rash and other nonspecific skin eruption: Secondary | ICD-10-CM | POA: Diagnosis not present

## 2022-07-24 DIAGNOSIS — T783XXA Angioneurotic edema, initial encounter: Secondary | ICD-10-CM | POA: Diagnosis not present

## 2022-07-24 DIAGNOSIS — L500 Allergic urticaria: Secondary | ICD-10-CM | POA: Diagnosis not present

## 2022-07-24 DIAGNOSIS — L501 Idiopathic urticaria: Secondary | ICD-10-CM | POA: Diagnosis not present

## 2022-07-25 ENCOUNTER — Telehealth: Payer: Self-pay

## 2022-07-25 DIAGNOSIS — R300 Dysuria: Secondary | ICD-10-CM

## 2022-07-25 NOTE — Telephone Encounter (Signed)
Patient called stating she seen her Creatinine levels are elevated via MyChart. I did let her know usually the provider waits till all results are back before resulting. She wanted to update Dr Ernst Bowler regarding the pain she is having in the kidney area on both sides. Patient is wondering could she possibly have a kidney infection with the Creatinine levels being elevated?

## 2022-07-27 NOTE — Addendum Note (Signed)
Addended by: Valentina Shaggy on: 07/27/2022 04:44 PM   Modules accepted: Orders

## 2022-07-27 NOTE — Addendum Note (Signed)
Addended by: Valentina Shaggy on: 07/27/2022 04:30 PM   Modules accepted: Orders

## 2022-07-27 NOTE — Telephone Encounter (Signed)
I do not think that usually happens with UTI. I am adding a urinalysis to check on that. She can run by a [facility] to get that collected. I will also re-do the creatinine to confirm that this was real.   Salvatore Marvel, MD [facility]

## 2022-07-30 NOTE — Telephone Encounter (Signed)
Patient would like to know if her elevated levels could be related to her xolair shots.   Please advise  Best contact number: [phone]

## 2022-07-30 NOTE — Telephone Encounter (Signed)
Called and informed patient Ashley [name]'s recommendation and that we will call back when Dr. Ernst Bowler responds. Patient verbalized understanding.

## 2022-07-31 DIAGNOSIS — R768 Other specified abnormal immunological findings in serum: Secondary | ICD-10-CM | POA: Diagnosis not present

## 2022-07-31 DIAGNOSIS — I7300 Raynaud's syndrome without gangrene: Secondary | ICD-10-CM | POA: Diagnosis not present

## 2022-07-31 DIAGNOSIS — M79642 Pain in left hand: Secondary | ICD-10-CM | POA: Diagnosis not present

## 2022-07-31 DIAGNOSIS — M791 Myalgia, unspecified site: Secondary | ICD-10-CM | POA: Diagnosis not present

## 2022-08-01 ENCOUNTER — Telehealth: Payer: Self-pay

## 2022-08-01 LAB — CMP14+EGFR
ALT: 37 IU/L — ABNORMAL HIGH (ref 0–32)
AST: 29 IU/L (ref 0–40)
Albumin/Globulin Ratio: 1.9 (ref 1.2–2.2)
Albumin: 4.8 g/dL (ref 3.9–4.9)
Alkaline Phosphatase: 86 IU/L (ref 44–121)
BUN/Creatinine Ratio: 15 (ref 12–28)
BUN: 23 mg/dL (ref 8–27)
Bilirubin Total: 0.3 mg/dL (ref 0.0–1.2)
CO2: 26 mmol/L (ref 20–29)
Calcium: 9.8 mg/dL (ref 8.7–10.3)
Chloride: 102 mmol/L (ref 96–106)
Creatinine, Ser: 1.54 mg/dL — ABNORMAL HIGH (ref 0.57–1.00)
Globulin, Total: 2.5 g/dL (ref 1.5–4.5)
Glucose: 101 mg/dL — ABNORMAL HIGH (ref 70–99)
Potassium: 4.8 mmol/L (ref 3.5–5.2)
Sodium: 142 mmol/L (ref 134–144)
Total Protein: 7.3 g/dL (ref 6.0–8.5)
eGFR: 38 mL/min/{1.73_m2} — ABNORMAL LOW (ref >59)

## 2022-08-01 LAB — CBC WITH DIFFERENTIAL
Basophils Absolute: 0.1 10*3/uL (ref 0.0–0.2)
Basos: 1 %
EOS (ABSOLUTE): 0.3 10*3/uL (ref 0.0–0.4)
Eos: 5 %
Hematocrit: 38.8 % (ref 34.0–46.6)
Hemoglobin: 12.7 g/dL (ref 11.1–15.9)
Immature Grans (Abs): 0 10*3/uL (ref 0.0–0.1)
Immature Granulocytes: 0 %
Lymphocytes Absolute: 1.7 10*3/uL (ref 0.7–3.1)
Lymphs: 30 %
MCH: 30.7 pg (ref 26.6–33.0)
MCHC: 32.7 g/dL (ref 31.5–35.7)
MCV: 94 fL (ref 79–97)
Monocytes Absolute: 0.5 10*3/uL (ref 0.1–0.9)
Monocytes: 8 %
Neutrophils Absolute: 3.2 10*3/uL (ref 1.4–7.0)
Neutrophils: 56 %
RBC: 4.14 x10E6/uL (ref 3.77–5.28)
RDW: 12.8 % (ref 11.7–15.4)
WBC: 5.8 10*3/uL (ref 3.4–10.8)

## 2022-08-01 LAB — FANA STAINING PATTERNS: CENTROMERE AB: 1:640 {titer} — ABNORMAL HIGH

## 2022-08-01 LAB — C-REACTIVE PROTEIN: CRP: 2 mg/L (ref 0–10)

## 2022-08-01 LAB — ANTINUCLEAR ANTIBODIES, IFA: ANA Titer 1: POSITIVE — AB

## 2022-08-01 LAB — SEDIMENTATION RATE: Sed Rate: 5 mm/hr (ref 0–40)

## 2022-08-01 LAB — CHRONIC URTICARIA: cu index: <1 (ref <10)

## 2022-08-01 LAB — TRYPTASE: Tryptase: 6.5 ug/L (ref 2.2–13.2)

## 2022-08-01 NOTE — Telephone Encounter (Signed)
Patient called the [place] office to let us know of her lab results with Dr.Hall on October 12th 2023. She wanted to send this message to Dr.G Creatinine:.96 GFR:66  Best [phone]

## 2022-08-03 NOTE — Telephone Encounter (Signed)
Noted - thanks!   Danh Bayus, MD [facility] [place]  

## 2022-08-13 ENCOUNTER — Telehealth: Payer: Self-pay

## 2022-08-13 ENCOUNTER — Encounter: Payer: Self-pay | Admitting: Internal Medicine

## 2022-08-13 ENCOUNTER — Ambulatory Visit: Payer: BC Managed Care – PPO | Admitting: Internal Medicine

## 2022-08-13 VITALS — BP 118/60 | HR 61 | Temp 97.9°F | Resp 16

## 2022-08-13 DIAGNOSIS — L253 Unspecified contact dermatitis due to other chemical products: Secondary | ICD-10-CM | POA: Diagnosis not present

## 2022-08-13 NOTE — Telephone Encounter (Signed)
-----   Message from Alfonse Spruce, MD sent at 08/03/2022  4:12 PM EDT ----- Can someone put in a referral for elevated ANA (R 76.0)?

## 2022-08-13 NOTE — Patient Instructions (Signed)
-   Instructions provided on care of the patches for the next 48 hours. Ashley [name] was instructed to avoid showering for the next 48 hours. [name] will follow up in 48 hours and 96 hours for patch readings.

## 2022-08-13 NOTE — Telephone Encounter (Signed)
Patient has seen [facility] in the last 6 months. Dr Dellis Anes has requested a 2nd opinion referral to [facility].  I did call patient to double check which office she has been seen with.   Referral has been placed and MyChart message has been sent to the patient.

## 2022-08-13 NOTE — Progress Notes (Signed)
    Follow-up Note  RE: Ashley [name] MRN: [id number] DOB: [birth year] Date of Office Visit: 08/13/2022  Primary care provider: Benita Stabile, MD Referring provider: Benita Stabile, MD   [name] returns to the office today for the patch test placement, given suspected history of contact dermatitis.    Diagnostics: True Test patches placed.    Plan:   Allergic contact dermatitis - Instructions provided on care of the patches for the next 48 hours. [name] was instructed to avoid showering for the next 48 hours. [name] will follow up in 48 hours and 96 hours for patch readings.    Alesia Morin MD [facility] [place]

## 2022-08-14 NOTE — Progress Notes (Signed)
    Follow-up Note  RE: Ashley [name] MRN: [id number] DOB: 1961/08/27 Date of Office Visit: 08/15/2022  Primary care provider: Benita Stabile, MD Referring provider: Benita Stabile, MD   [name] returns to the office today for the initial patch test interpretation, given suspected history of contact dermatitis.    Diagnostics:   TRUE TEST 48-hour hour reading: Was negative to the patch   Plan:   Allergic contact dermatitis Patch was negative at today's reading Care of testing area reviewed  Call the clinic if this treatment plan is not working well for you  Follow up in 2 days or sooner if needed.   Thank you for the opportunity to care for this patient.  Please do not hesitate to contact me with questions.  Thermon Leyland, FNP [facility]

## 2022-08-15 ENCOUNTER — Encounter: Payer: Self-pay | Admitting: Family Medicine

## 2022-08-15 ENCOUNTER — Ambulatory Visit: Payer: BC Managed Care – PPO | Admitting: Family Medicine

## 2022-08-15 DIAGNOSIS — L253 Unspecified contact dermatitis due to other chemical products: Secondary | ICD-10-CM | POA: Insufficient documentation

## 2022-08-15 NOTE — Patient Instructions (Signed)
Diagnostics:   TRUE TEST 48-hour hour reading: Was negative to the patch   Plan:   Allergic contact dermatitis Patch was negative at today's reading Care of testing area reviewed  Call the clinic if this treatment plan is not working well for you  Follow up in 2 days or sooner if needed.

## 2022-08-16 DIAGNOSIS — H16223 Keratoconjunctivitis sicca, not specified as Sjogren's, bilateral: Secondary | ICD-10-CM | POA: Diagnosis not present

## 2022-08-16 DIAGNOSIS — H1013 Acute atopic conjunctivitis, bilateral: Secondary | ICD-10-CM | POA: Diagnosis not present

## 2022-08-17 ENCOUNTER — Ambulatory Visit: Payer: BC Managed Care – PPO | Admitting: Family Medicine

## 2022-08-17 ENCOUNTER — Encounter: Payer: Self-pay | Admitting: Family Medicine

## 2022-08-17 VITALS — Temp 97.6°F

## 2022-08-17 DIAGNOSIS — L253 Unspecified contact dermatitis due to other chemical products: Secondary | ICD-10-CM

## 2022-08-17 NOTE — Progress Notes (Signed)
    Follow-up Note  RE: Ashley [name] MRN: [id number] DOB: 11-24-1960 Date of Office Visit: 08/17/2022  Primary care provider: Benita Stabile, MD Referring provider: Benita Stabile, MD   [name] returns to the office today for the final patch test interpretation, given suspected history of contact dermatitis.    Diagnostics:   TRUE TEST 96-hour hour reading:  possible reaction to #22 (Mercapto mix)   Plan:   Allergic contact dermatitis - The patient has been provided detailed information regarding the substances she is sensitive to, as well as products containing the substances.   - Meticulous avoidance of these substances is recommended.  - If avoidance is not possible, the use of barrier creams or lotions is recommended. - If symptoms persist or progress despite meticulous avoidance of possible reaction to Mercapto mix, a dermatology referral may be warranted. - We will send an email detailing safe products  Call the clinic if this treatment plan is not working well for you  Follow up in 3 months or sooner if needed.  Thank you for the opportunity to care for this patient.  Please do not hesitate to contact me with questions.  Thermon Leyland, FNP [facility]

## 2022-08-17 NOTE — Patient Instructions (Signed)
Diagnostics:   TRUE TEST 96-hour hour reading: possible reaction to #22 (Mercapto mix)   Plan:   Allergic contact dermatitis - The patient has been provided detailed information regarding the substances she is sensitive to, as well as products containing the substances.   - Meticulous avoidance of these substances is recommended.  - If avoidance is not possible, the use of barrier creams or lotions is recommended. - If symptoms persist or progress despite meticulous avoidance of possible reaction to Mercapto mix, a dermatology referral may be warranted. - We will send an email detailing safe products  Call the clinic if this treatment plan is not working well for you  Follow up in 3 months or sooner if needed.

## 2022-08-29 DIAGNOSIS — J019 Acute sinusitis, unspecified: Secondary | ICD-10-CM | POA: Diagnosis not present

## 2022-09-26 ENCOUNTER — Telehealth: Payer: Self-pay | Admitting: Allergy & Immunology

## 2022-09-26 DIAGNOSIS — T783XXD Angioneurotic edema, subsequent encounter: Secondary | ICD-10-CM

## 2022-09-26 NOTE — Telephone Encounter (Signed)
Patient came in with her sister and reported that she might have a citrus allergy. She also reports some symptoms with pickles. I am ordering IgE testing to confirm this. She has a follow up appointment scheduled.

## 2022-09-27 NOTE — Telephone Encounter (Signed)
Is it ok to order extra labs for the patient, Dr. Dellis Anes?

## 2022-09-27 NOTE — Telephone Encounter (Signed)
Patient called and would like to have bloodwork done across to hosptial and want be rested for onions and pomegranate . She thinks she has a allergy to them  [phone]

## 2022-09-28 NOTE — Telephone Encounter (Signed)
Ordered labs through [facility].

## 2022-09-28 NOTE — Addendum Note (Signed)
Addended by: Alfonse Spruce on: 09/28/2022 01:01 PM   Modules accepted: Orders

## 2022-10-02 DIAGNOSIS — T783XXD Angioneurotic edema, subsequent encounter: Secondary | ICD-10-CM | POA: Diagnosis not present

## 2022-10-04 LAB — ALLERGEN, ORANGE F33: Orange: <0.1 kU/L

## 2022-10-04 LAB — ALLERGEN, CUCUMBER, F244: Allergen Cucumber IgE: <0.1 kU/L

## 2022-10-06 LAB — ALLERGEN, ONION, F48: Allergen Onion IgE: <0.1 kU/L

## 2022-10-06 LAB — ALLERGEN, POMEGRANATE
Class Interpretation: 0
Pomegranate IgE: <0.35 kU/L (ref <0.35)

## 2022-10-09 ENCOUNTER — Encounter: Payer: Self-pay | Admitting: Allergy & Immunology

## 2022-10-15 NOTE — Telephone Encounter (Signed)
Opened chart error.

## 2022-11-21 ENCOUNTER — Ambulatory Visit: Payer: BC Managed Care – PPO | Admitting: Allergy & Immunology

## 2022-12-10 DIAGNOSIS — S233XXA Sprain of ligaments of thoracic spine, initial encounter: Secondary | ICD-10-CM | POA: Diagnosis not present

## 2022-12-10 DIAGNOSIS — S134XXA Sprain of ligaments of cervical spine, initial encounter: Secondary | ICD-10-CM | POA: Diagnosis not present

## 2022-12-10 DIAGNOSIS — R42 Dizziness and giddiness: Secondary | ICD-10-CM | POA: Diagnosis not present

## 2023-01-16 DIAGNOSIS — E0590 Thyrotoxicosis, unspecified without thyrotoxic crisis or storm: Secondary | ICD-10-CM | POA: Diagnosis not present

## 2023-01-16 DIAGNOSIS — E782 Mixed hyperlipidemia: Secondary | ICD-10-CM | POA: Diagnosis not present

## 2023-01-16 DIAGNOSIS — Z139 Encounter for screening, unspecified: Secondary | ICD-10-CM | POA: Diagnosis not present

## 2023-01-16 DIAGNOSIS — I10 Essential (primary) hypertension: Secondary | ICD-10-CM | POA: Diagnosis not present

## 2023-01-22 ENCOUNTER — Other Ambulatory Visit (HOSPITAL_COMMUNITY): Payer: Self-pay | Admitting: Internal Medicine

## 2023-01-22 DIAGNOSIS — Z13820 Encounter for screening for osteoporosis: Secondary | ICD-10-CM

## 2023-01-22 DIAGNOSIS — I10 Essential (primary) hypertension: Secondary | ICD-10-CM | POA: Diagnosis not present

## 2023-01-22 DIAGNOSIS — I479 Paroxysmal tachycardia, unspecified: Secondary | ICD-10-CM | POA: Diagnosis not present

## 2023-01-22 DIAGNOSIS — Z1231 Encounter for screening mammogram for malignant neoplasm of breast: Secondary | ICD-10-CM

## 2023-01-22 DIAGNOSIS — E782 Mixed hyperlipidemia: Secondary | ICD-10-CM | POA: Diagnosis not present

## 2023-01-22 DIAGNOSIS — G4700 Insomnia, unspecified: Secondary | ICD-10-CM | POA: Diagnosis not present

## 2023-01-22 DIAGNOSIS — I499 Cardiac arrhythmia, unspecified: Secondary | ICD-10-CM | POA: Diagnosis not present

## 2023-01-22 DIAGNOSIS — Z0000 Encounter for general adult medical examination without abnormal findings: Secondary | ICD-10-CM | POA: Diagnosis not present

## 2023-01-22 DIAGNOSIS — E039 Hypothyroidism, unspecified: Secondary | ICD-10-CM | POA: Diagnosis not present

## 2023-01-25 ENCOUNTER — Encounter: Payer: Self-pay | Admitting: Adult Health

## 2023-01-25 ENCOUNTER — Other Ambulatory Visit (HOSPITAL_COMMUNITY)
Admission: RE | Admit: 2023-01-25 | Discharge: 2023-01-25 | Disposition: A | Discharge status: Home / Self Care | Payer: BC Managed Care – PPO | Source: Ambulatory Visit | Attending: Adult Health | Admitting: Adult Health

## 2023-01-25 ENCOUNTER — Ambulatory Visit (INDEPENDENT_AMBULATORY_CARE_PROVIDER_SITE_OTHER): Payer: BC Managed Care – PPO | Admitting: Adult Health

## 2023-01-25 VITALS — BP 135/77 | HR 57 | Ht 71.0 in | Wt 193.5 lb

## 2023-01-25 DIAGNOSIS — N898 Other specified noninflammatory disorders of vagina: Secondary | ICD-10-CM | POA: Diagnosis not present

## 2023-01-25 DIAGNOSIS — R682 Dry mouth, unspecified: Secondary | ICD-10-CM

## 2023-01-25 DIAGNOSIS — H04129 Dry eye syndrome of unspecified lacrimal gland: Secondary | ICD-10-CM | POA: Insufficient documentation

## 2023-01-25 DIAGNOSIS — R232 Flushing: Secondary | ICD-10-CM

## 2023-01-25 DIAGNOSIS — Z01419 Encounter for gynecological examination (general) (routine) without abnormal findings: Secondary | ICD-10-CM | POA: Diagnosis not present

## 2023-01-25 DIAGNOSIS — Z1211 Encounter for screening for malignant neoplasm of colon: Secondary | ICD-10-CM | POA: Diagnosis not present

## 2023-01-25 LAB — HEMOCCULT GUIAC POC 1CARD (OFFICE): Fecal Occult Blood, POC: NEGATIVE

## 2023-01-25 MED ORDER — DUAVEE 0.45-20 MG PO TABS: 1.0000 | ORAL_TABLET | Freq: Every day | ORAL | 0 refills | Status: DC

## 2023-01-25 MED ORDER — ZOLPIDEM TARTRATE 10 MG PO TABS: 10.0000 mg | ORAL_TABLET | Freq: Every day | ORAL | 0 refills | Status: AC

## 2023-01-25 NOTE — Progress Notes (Signed)
Patient ID: Ashley [name], female   DOB: Apr 14, 1961, 62 y.o.   MRN: [id number] History of Present Illness: Ashley [name] is a 62 year old white female, married, PM in for a well woman gyn exam and pap. She denies any vaginal bleeding. She is having dryness in vagina, eyes and mouth and some hot flashes.  She was on [facility] in the past but stopped during COVID when could not get.  She requested refill on ambien.  PCP is Dr Margo Aye.   Current Medications, Allergies, Past Medical History, Past Surgical History, Family History and Social History were reviewed in [employer] record.     Review of Systems: Patient denies any headaches, hearing loss, fatigue, blurred vision, shortness of breath, chest pain, abdominal pain, problems with bowel movements, urination, or intercourse(not active). No joint pain or mood swings.     Physical Exam:BP 135/77 (BP Location: Left Arm, Patient Position: Sitting, Cuff Size: Normal)   Pulse (!) 57   Ht 5\' 11"  (1.803 m)   Wt 193 lb 8 oz (87.8 kg)   BMI 26.99 kg/m   General:  Well developed, well nourished, no acute distress Skin:  Warm and dry Neck:  Midline trachea, normal thyroid, good ROM, no lymphadenopathy,no carotid bruits heard Lungs; Clear to auscultation bilaterally Breast:  No dominant palpable mass, retraction, or nipple discharge Cardiovascular: Regular rate and rhythm Abdomen:  Soft, non tender, no hepatosplenomegaly Pelvic:  External genitalia is normal in appearance, no lesions.  The vagina is pale with loss of rugae and moisture. Urethra has no lesions or masses. The cervix is smooth, pap with HR HPV genotyping performed.  Uterus is felt to be normal size, shape, and contour.  No adnexal masses or tenderness noted.Bladder is non tender, no masses felt. Rectal: Good sphincter tone, no polyps, or hemorrhoids felt.  Hemoccult negative. Extremities/musculoskeletal:  No swelling or varicosities noted, no clubbing or cyanosis Psych:  No  mood changes, alert and cooperative,seems happy AA is 0 Fall risk is low    01/25/2023    8:32 AM 04/04/2021   10:03 AM 10/08/2019    9:44 AM  Depression screen PHQ 2/9  Decreased Interest 0 0 0  Down, Depressed, Hopeless 0 1 0  PHQ - 2 Score 0 1 0  Altered sleeping 1 3   Tired, decreased energy 0 0   Change in appetite 0 0   Feeling bad or failure about yourself  0 0   Trouble concentrating 0 0   Moving slowly or fidgety/restless 0 0   Suicidal thoughts 0 0   PHQ-9 Score 1 4        01/25/2023    8:32 AM 04/04/2021   10:03 AM  GAD 7 : Generalized Anxiety Score  Nervous, Anxious, on Edge 0 0  Control/stop worrying 0 0  Worry too much - different things 0 0  Trouble relaxing 0 0  Restless 0 0  Easily annoyed or irritable 0 0  Afraid - awful might happen 0 0  Total GAD 7 Score 0 0      Upstream - 01/25/23 0840       Pregnancy Intention Screening   Does the patient want to become pregnant in the next year? N/A    Does the patient's partner want to become pregnant in the next year? N/A    Would the patient like to discuss contraceptive options today? N/A      Contraception Wrap Up   Current Method Abstinence;No Method -  Other Reason   postmenopausal   Reason for No Current Contraceptive Method at Intake ([facility] Only) Other    End Method Abstinence;No Method - Other Reason   postmenopausal   Contraception Counseling Provided No             Examination chaperoned by Malachy Mood LPN  Impression and Plan: 1. Encounter for gynecological examination with Papanicolaou smear of cervix Pap sent Pap in 3 years if normal Physical in 1 year Mammogram is scheduled for 02/01/23 DEXA is scheduled for 02/27/23 CT cardiac is scheduled for 03/20/23 Colonoscopy per GI  Labs with PCP, reviewed with her today  - Cytology - PAP( [place])  2. Encounter for screening fecal occult blood testing Hemoccult was negative  - POCT occult blood stool  3. Vaginal dryness Will try  Duavee  4. Hot flashes Will resume Duavee, I gave her samples she will let me know if wants Rx Did discuss resuming Duavee with Dr Despina Hidden and he said to OK to try it now  Meds ordered this encounter  Medications   Conj Estrogens-Bazedoxifene (DUAVEE) 0.45-20 MG TABS    Sig: Take 1 tablet by mouth daily.    Dispense:  28 tablet    Refill:  0    Order Specific Question:   Supervising Provider    Answer:   Despina Hidden, LUTHER H [[id number]]   zolpidem (AMBIEN) 10 MG tablet    Sig: Take 1 tablet (10 mg total) by mouth daily.    Dispense:  30 tablet    Refill:  0    Order Specific Question:   Supervising Provider    Answer:   Despina Hidden, LUTHER H [[id number]]     5. Oral dryness Will try Duavee to see if helps any   6. Eye dryness

## 2023-01-29 LAB — CYTOLOGY - PAP
Adequacy: ABSENT
Comment: NEGATIVE
Diagnosis: NEGATIVE
High risk HPV: NEGATIVE

## 2023-02-01 ENCOUNTER — Ambulatory Visit (HOSPITAL_COMMUNITY)
Admission: RE | Admit: 2023-02-01 | Discharge: 2023-02-01 | Disposition: A | Discharge status: Home / Self Care | Payer: BC Managed Care – PPO | Source: Ambulatory Visit | Attending: Internal Medicine | Admitting: Internal Medicine

## 2023-02-01 DIAGNOSIS — Z1231 Encounter for screening mammogram for malignant neoplasm of breast: Secondary | ICD-10-CM | POA: Insufficient documentation

## 2023-02-01 DIAGNOSIS — Z13820 Encounter for screening for osteoporosis: Secondary | ICD-10-CM

## 2023-02-12 DIAGNOSIS — S233XXA Sprain of ligaments of thoracic spine, initial encounter: Secondary | ICD-10-CM | POA: Diagnosis not present

## 2023-02-12 DIAGNOSIS — R42 Dizziness and giddiness: Secondary | ICD-10-CM | POA: Diagnosis not present

## 2023-02-12 DIAGNOSIS — S134XXA Sprain of ligaments of cervical spine, initial encounter: Secondary | ICD-10-CM | POA: Diagnosis not present

## 2023-02-15 ENCOUNTER — Telehealth: Payer: Self-pay | Admitting: Adult Health

## 2023-02-15 MED ORDER — DUAVEE 0.45-20 MG PO TABS: 1.0000 | ORAL_TABLET | Freq: Every day | ORAL | 3 refills | Status: DC

## 2023-02-15 NOTE — Telephone Encounter (Signed)
Will refill [facility]

## 2023-02-15 NOTE — Addendum Note (Signed)
Addended by: Cyril Mourning A on: 02/15/2023 10:59 AM   Modules accepted: Orders

## 2023-02-15 NOTE — Telephone Encounter (Signed)
Pt states Dianna Rossetti is working and she would like prescription sent to [facility] in [place]. 3 month supply if possible. Thanks! JSY

## 2023-02-15 NOTE — Telephone Encounter (Signed)
Patient called to say medicine Ashley [name]) is working and is okay to send refill in to [facility] in [place].  3 month supply

## 2023-02-16 ENCOUNTER — Ambulatory Visit
Admission: EM | Admit: 2023-02-16 | Discharge: 2023-02-16 | Disposition: A | Discharge status: Home / Self Care | Payer: BC Managed Care – PPO | Attending: Nurse Practitioner | Admitting: Nurse Practitioner

## 2023-02-16 DIAGNOSIS — R21 Rash and other nonspecific skin eruption: Secondary | ICD-10-CM

## 2023-02-16 MED ORDER — DEXAMETHASONE SODIUM PHOSPHATE 10 MG/ML IJ SOLN
10.0000 mg | INTRAMUSCULAR | Status: AC
  Administered 2023-02-16: 10 mg via INTRAMUSCULAR

## 2023-02-16 MED ORDER — PREDNISONE 20 MG PO TABS: 40.0000 mg | ORAL_TABLET | Freq: Every day | ORAL | 0 refills | Status: AC

## 2023-02-16 MED ORDER — TRIAMCINOLONE ACETONIDE 0.1 % EX CREA: 1.0000 | TOPICAL_CREAM | Freq: Two times a day (BID) | CUTANEOUS | 0 refills | Status: DC

## 2023-02-16 NOTE — ED Triage Notes (Signed)
The patient reports having a rash that has been spreading (now up to face) that began about a week and a half ago.   Home interventions: hydrocortisone

## 2023-02-16 NOTE — ED Provider Notes (Signed)
[facility]    CSN: [id number] Arrival date & time: 02/16/23  4098      History   Chief Complaint Chief Complaint  Patient presents with   Rash    HPI Ashley [name] is a 62 y.o. female.   The history is provided by the patient.   The patient presents for complaints of rash has been present for the past 1-1/2 weeks.  Patient states rash has started to spread to her face.  She states rash started on her right lower extremity.  Patient states that the rash is itchy.  He denies fever, chills, exposure to new soaps, medications, lotions, or detergents.  She reports she has been using hydrocortisone cream and ibuprofen for her symptoms.  Patient states that she is unsure if she direct contact with the plant such as poison ivy, but is wondering if her dog may have exposed her.  Past Medical History:  Diagnosis Date   Allergy    Angio-edema    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   Shingles rash 08/05/2015   possible - will start Valtrex today   Tachycardia    no cardiologist    Patient Active Problem List   Diagnosis Date Noted   Vaginal dryness 01/25/2023   Oral dryness 01/25/2023   Eye dryness 01/25/2023   Contact dermatitis due to chemicals 08/15/2022   Angio-edema 04/22/2022   Periorbital swelling 04/22/2022   Idiopathic urticaria 04/22/2022   Seasonal allergic conjunctivitis 04/22/2022   Chronic rhinitis 04/22/2022   Encounter for screening fecal occult blood testing 04/04/2021   Encounter for well woman exam with routine gynecological exam 04/04/2021    Screening mammogram for breast cancer 04/04/2021   Muscle cramps 07/17/2020   Screening for colorectal cancer 10/08/2019   Encounter for gynecological examination with Papanicolaou smear of cervix 10/08/2019   Hypertension 08/25/2019   Gastroesophageal reflux disease without esophagitis 08/25/2019   Midline low back pain 08/25/2019   Tachycardia 08/25/2019   Special screening for malignant neoplasms, colon    Hypothyroidism 06/16/2015   Vaginal irritation 11/04/2014   Hematuria 11/04/2014   Hot flashes 11/04/2014   Hormone replacement therapy (HRT) 11/04/2014   PMB (postmenopausal bleeding) 02/15/2014   RUPTURE ROTATOR CUFF 03/09/2009   Pain in joint, shoulder region 06/23/2008   IMPINGEMENT SYNDROME 06/23/2008    Past Surgical History:  Procedure Laterality Date   APPENDECTOMY  1966   CHOLECYSTECTOMY  04/18/2007   CHONDROPLASTY Right 08/11/2015   Procedure: CHONDROPLASTY;  Surgeon: Loreta Ave, MD;  Location: [facility];  Service: Orthopedics;  Laterality: Right;   COLONOSCOPY N/A 07/15/2015   Procedure: COLONOSCOPY;  Surgeon: West Bali, MD;  Location: [facility];  Service: Endoscopy;  Laterality: N/A;  10:45 AM   ENDOMETRIAL ABLATION  03/14/2011   HYSTEROSCOPY WITH D & C  03/14/2011   KNEE ARTHROSCOPY WITH EXCISION PLICA Right 08/11/2015   Procedure: KNEE ARTHROSCOPY WITH EXCISION PLICA;  Surgeon: Loreta Ave, MD;  Location: [facility];  Service: Orthopedics;  Laterality: Right;   SHOULDER ARTHROSCOPY Right    SPINE SURGERY      OB History     Gravida  7   Para  6   Term      Preterm      AB  1   Living  6      SAB  1   IAB      Ectopic      Multiple      Live Births               Home Medications    Prior to Admission medications   Medication Sig Start Date End Date Taking? Authorizing Provider  predniSONE (DELTASONE) 20 MG tablet Take 2 tablets (40 mg total) by mouth daily with breakfast for 5 days.  02/16/23 02/21/23 Yes Ernesto Zukowski-Warren, Sadie Haber, NP  triamcinolone cream (KENALOG) 0.1 % Apply 1 Application topically 2 (two) times daily. To the affected areas twice daily as needed until symptoms improve. 02/16/23  Yes Edeline Greening-Warren, Sadie Haber, NP  atenolol (TENORMIN) 50 MG tablet Take 1 tablet (50 mg total) by mouth daily. 10/09/19   Corum, Minerva Fester, MD  calcium carbonate (OS-CAL) 600 MG TABS tablet Take 600 mg by mouth daily.     [provider]  CEQUA 0.09 % SOLN Apply 1 drop to eye 2 (two) times daily. 07/06/22   [provider]  Conj Estrogens-Bazedoxifene (DUAVEE) 0.45-20 MG TABS Take 1 tablet by mouth daily. 02/15/23   Adline Potter, NP  EPINEPHrine 0.3 mg/0.3 mL IJ SOAJ injection Inject 0.3 mg into the muscle as needed for anaphylaxis. As needed for life-threatening allergic reactions 05/07/22   Alfonse Spruce, MD  esomeprazole (NEXIUM) 40 MG capsule Take 1 capsule (40 mg total) by mouth daily. 11/16/19   Corum, Minerva Fester, MD  ketotifen (ZADITOR) 0.025 % ophthalmic solution 1 drop 2 (two) times daily.    [provider]  levothyroxine (SYNTHROID) 137 MCG tablet Take 1 tablet (137 mcg total) by mouth daily before breakfast. 07/19/20   Romero Belling, MD  Loteprednol Etabonate (EYSUVIS) 0.25 % SUSP Apply to eye.    [provider]  MELATONIN PO Take by mouth.    [provider]  MIEBO 1.338 GM/ML SOLN Apply to eye. 08/08/22   [provider]  Multiple Vitamin (MULTIVITAMIN) tablet Take 1 tablet by mouth daily.    [provider]  NAPROXEN PO Take by mouth in the morning and at bedtime.    [provider]  zolpidem (AMBIEN) 10 MG tablet Take 1 tablet (10 mg total) by mouth daily. 01/25/23   Adline Potter, NP    Family History Family History  Problem Relation Age of Onset   Hypertension Mother    Heart disease Mother        pacemaker   Dementia Mother    Hypertension Father    Parkinson's disease Father     Thyroid disease Father    Cancer Sister    Mental illness Sister    Bipolar disorder Sister    Cancer Maternal Grandmother        breast,eye   Heart disease Maternal Grandmother    Heart attack Maternal Grandfather    Alcohol abuse Paternal Grandfather    Anesthesia problems Daughter        post-op N/V   Other Son        Lyme disease    Social History Social History   Tobacco Use   Smoking  status: Never   Smokeless tobacco: Never  Vaping Use   Vaping Use: Never used  Substance Use Topics   Alcohol use: No   Drug use: No     Allergies   Sulfa antibiotics, Nitrofurantoin, Other, and Macrobid [nitrofurantoin monohyd macro]   Review of Systems Review of Systems Per HPI  Physical Exam Triage Vital Signs ED Triage Vitals  Enc Vitals Group     BP 02/16/23 0922 105/70     Pulse Rate 02/16/23 0922 67     Resp 02/16/23 0922 18     Temp 02/16/23 0922 97.6 F (36.4 C)     Temp Source 02/16/23 0922 Oral     SpO2 02/16/23 0922 96 %     Weight --      Height --      Head Circumference --      Peak Flow --      Pain Score 02/16/23 0921 8     Pain Loc --      Pain Edu? --      Excl. in GC? --    No data found.  Updated Vital Signs BP 105/70 (BP Location: Right Arm)   Pulse 67   Temp 97.6 F (36.4 C) (Oral)   Resp 18   SpO2 96%   Visual Acuity Right Eye Distance:   Left Eye Distance:   Bilateral Distance:    Right Eye Near:   Left Eye Near:    Bilateral Near:     Physical Exam Vitals and nursing note reviewed.  Constitutional:      General: She is not in acute distress.    Appearance: Normal appearance.  HENT:     Head: Normocephalic.  Eyes:     Extraocular Movements: Extraocular movements intact.     Conjunctiva/sclera: Conjunctivae normal.     Pupils: Pupils are equal, round, and reactive to light.  Cardiovascular:     Rate and Rhythm: Normal rate and regular rhythm.     Pulses: Normal pulses.     Heart sounds: Normal heart sounds.   Pulmonary:     Effort: Pulmonary effort is normal.     Breath sounds: Normal breath sounds.  Abdominal:     General: Bowel sounds are normal.     Palpations: Abdomen is soft.     Tenderness: There is no abdominal tenderness.  Musculoskeletal:     Cervical back: Normal range of motion.  Lymphadenopathy:     Cervical: No cervical adenopathy.  Skin:    General: Skin is warm and dry.     Findings: Rash present. Rash is macular and papular.     Comments: Maculopapular rash noted to the patient's face, right lower extremity, and bilateral upper extremities.  Rashes and no congruent pattern.  Areas of the rash are raised borders, areas are annular and linear.  There is no oozing, fluctuance, or drainage present.  Neurological:     General: No focal deficit present.     Mental Status: She is alert and oriented to person, place, and time.  Psychiatric:        Mood and Affect: Mood normal.        Behavior: Behavior normal.      UC Treatments / Results  Labs (all labs ordered are listed, but only abnormal results are displayed) Labs Reviewed - No data to display  EKG   Radiology No results found.  Procedures Procedures (including critical care time)  Medications Ordered in UC Medications  dexamethasone (DECADRON)  injection 10 mg (has no administration in time range)    Initial Impression / Assessment and Plan / UC Course  I have reviewed the triage vital signs and the nursing notes.  Pertinent labs & imaging results that were available during my care of the patient were reviewed by me and considered in my medical decision making (see chart for details).  The patient is well-appearing, she is in no acute distress, vital signs are stable.  Will treat patient for rash and nonspecific skin eruption.  Symptoms likely caused by poison ivy.  Patient was administered Decadron 10 mg IM.  Patient was prescribed prednisone 40 mg for the next 5 days with inflammation along with  triamcinolone cream 0.1% for the rash.  Supportive care recommendations were provided and discussed with the patient to include use of Zyrtec or Benadryl to help with itching, baths or showers, and use of Aveeno colloidal oatmeal bath to help with itching.  Patient is in agreement with this plan of care.  She was advised to follow-up if symptoms fail to improve.  Patient verbalizes understanding, all questions were answered.  Patient stable for discharge.   Final Clinical Impressions(s) / UC Diagnoses   Final diagnoses:  Rash and nonspecific skin eruption     Discharge Instructions      You have been given an injection of Decadron 10 mg.  You will start the prednisone on 02/17/2023. Take medication as prescribed. May continue ibuprofen as for pain, fever, or general discomfort. May take over-the-counter Zyrtec during the day, and Benadryl at bedtime to help with itching. Avoid baths or showers also once persist.  Recommend lukewarm baths or showers. Scratching, rubbing, or manipulating the areas while symptoms persist. Recommend Aveeno Colloidal Oatmeal Bath to help with itching and drying of the rash.  You may purchase this at your local pharmacy. Follow-up if symptoms do not improve.       ED Prescriptions     Medication Sig Dispense Auth. Provider   predniSONE (DELTASONE) 20 MG tablet Take 2 tablets (40 mg total) by mouth daily with breakfast for 5 days. 10 tablet Schuyler Behan-Warren, Sadie Haber, NP   triamcinolone cream (KENALOG) 0.1 % Apply 1 Application topically 2 (two) times daily. To the affected areas twice daily as needed until symptoms improve. 45 g Nikash Mortensen-Warren, Sadie Haber, NP      PDMP not reviewed this encounter.   Abran Cantor, NP 02/16/23 [phone]

## 2023-02-16 NOTE — Discharge Instructions (Addendum)
You have been given an injection of Decadron 10 mg.  You will start the prednisone on 02/17/2023. Take medication as prescribed. May continue ibuprofen as for pain, fever, or general discomfort. May take over-the-counter Zyrtec during the day, and Benadryl at bedtime to help with itching. Avoid baths or showers also once persist.  Recommend lukewarm baths or showers. Scratching, rubbing, or manipulating the areas while symptoms persist. Recommend Aveeno Colloidal Oatmeal Bath to help with itching and drying of the rash.  You may purchase this at your local pharmacy. Follow-up if symptoms do not improve.

## 2023-02-21 ENCOUNTER — Other Ambulatory Visit: Payer: Self-pay | Admitting: Adult Health

## 2023-02-21 MED ORDER — DUAVEE 0.45-20 MG PO TABS: 1.0000 | ORAL_TABLET | Freq: Every day | ORAL | 3 refills | Status: DC

## 2023-02-21 NOTE — Progress Notes (Signed)
Sent rx for [facility]

## 2023-02-27 ENCOUNTER — Ambulatory Visit (HOSPITAL_COMMUNITY)
Admission: RE | Admit: 2023-02-27 | Discharge: 2023-02-27 | Disposition: A | Discharge status: Home / Self Care | Payer: BC Managed Care – PPO | Source: Ambulatory Visit | Attending: Internal Medicine | Admitting: Internal Medicine

## 2023-02-27 DIAGNOSIS — Z13820 Encounter for screening for osteoporosis: Secondary | ICD-10-CM | POA: Insufficient documentation

## 2023-02-27 DIAGNOSIS — M810 Age-related osteoporosis without current pathological fracture: Secondary | ICD-10-CM | POA: Diagnosis not present

## 2023-03-12 ENCOUNTER — Other Ambulatory Visit (HOSPITAL_COMMUNITY): Payer: Self-pay | Admitting: Pharmacy Technician

## 2023-03-12 DIAGNOSIS — M810 Age-related osteoporosis without current pathological fracture: Secondary | ICD-10-CM | POA: Insufficient documentation

## 2023-03-13 ENCOUNTER — Encounter: Payer: Self-pay | Admitting: Internal Medicine

## 2023-03-20 ENCOUNTER — Ambulatory Visit (HOSPITAL_COMMUNITY): Payer: BC Managed Care – PPO

## 2023-03-20 ENCOUNTER — Encounter (HOSPITAL_COMMUNITY): Payer: Self-pay

## 2023-03-26 DIAGNOSIS — S134XXA Sprain of ligaments of cervical spine, initial encounter: Secondary | ICD-10-CM | POA: Diagnosis not present

## 2023-03-26 DIAGNOSIS — S233XXA Sprain of ligaments of thoracic spine, initial encounter: Secondary | ICD-10-CM | POA: Diagnosis not present

## 2023-03-26 DIAGNOSIS — R42 Dizziness and giddiness: Secondary | ICD-10-CM | POA: Diagnosis not present

## 2023-03-29 ENCOUNTER — Telehealth: Payer: Self-pay | Admitting: Pharmacy Technician

## 2023-03-29 NOTE — Telephone Encounter (Addendum)
Auth Submission: APPROVED Site of care: [facility] Payer: [employer] [place] Medication & CPT/J Code(s) submitted: Prolia (Denosumab) [id number] Route of submission (phone, fax, portal):  Phone # [phone] Fax #[phone] Auth type: Buy/Bill Units/visits requested: X2 Reference number: [id number] Approval from: 03/29/23 - 03/27/24  Co-pay card: Pending [employer] aware

## 2023-04-01 ENCOUNTER — Encounter: Payer: Self-pay | Admitting: Internal Medicine

## 2023-04-02 DIAGNOSIS — S233XXA Sprain of ligaments of thoracic spine, initial encounter: Secondary | ICD-10-CM | POA: Diagnosis not present

## 2023-04-02 DIAGNOSIS — R42 Dizziness and giddiness: Secondary | ICD-10-CM | POA: Diagnosis not present

## 2023-04-02 DIAGNOSIS — S134XXA Sprain of ligaments of cervical spine, initial encounter: Secondary | ICD-10-CM | POA: Diagnosis not present

## 2023-04-03 ENCOUNTER — Other Ambulatory Visit: Payer: Self-pay

## 2023-04-17 ENCOUNTER — Other Ambulatory Visit: Payer: Self-pay

## 2023-04-17 DIAGNOSIS — S134XXA Sprain of ligaments of cervical spine, initial encounter: Secondary | ICD-10-CM | POA: Diagnosis not present

## 2023-04-17 DIAGNOSIS — R42 Dizziness and giddiness: Secondary | ICD-10-CM | POA: Diagnosis not present

## 2023-04-17 DIAGNOSIS — S233XXA Sprain of ligaments of thoracic spine, initial encounter: Secondary | ICD-10-CM | POA: Diagnosis not present

## 2023-04-23 ENCOUNTER — Encounter (HOSPITAL_COMMUNITY): Payer: Self-pay

## 2023-04-23 ENCOUNTER — Ambulatory Visit (HOSPITAL_COMMUNITY)
Admission: RE | Admit: 2023-04-23 | Discharge: 2023-04-23 | Disposition: A | Discharge status: Home / Self Care | Payer: BC Managed Care – PPO | Source: Ambulatory Visit | Attending: Internal Medicine | Admitting: Internal Medicine

## 2023-04-23 DIAGNOSIS — E782 Mixed hyperlipidemia: Secondary | ICD-10-CM | POA: Insufficient documentation

## 2023-05-21 DIAGNOSIS — S134XXA Sprain of ligaments of cervical spine, initial encounter: Secondary | ICD-10-CM | POA: Diagnosis not present

## 2023-05-21 DIAGNOSIS — R42 Dizziness and giddiness: Secondary | ICD-10-CM | POA: Diagnosis not present

## 2023-05-21 DIAGNOSIS — S233XXA Sprain of ligaments of thoracic spine, initial encounter: Secondary | ICD-10-CM | POA: Diagnosis not present

## 2023-06-04 DIAGNOSIS — S134XXA Sprain of ligaments of cervical spine, initial encounter: Secondary | ICD-10-CM | POA: Diagnosis not present

## 2023-06-04 DIAGNOSIS — R42 Dizziness and giddiness: Secondary | ICD-10-CM | POA: Diagnosis not present

## 2023-06-04 DIAGNOSIS — S233XXA Sprain of ligaments of thoracic spine, initial encounter: Secondary | ICD-10-CM | POA: Diagnosis not present

## 2023-06-18 DIAGNOSIS — S233XXA Sprain of ligaments of thoracic spine, initial encounter: Secondary | ICD-10-CM | POA: Diagnosis not present

## 2023-06-18 DIAGNOSIS — R42 Dizziness and giddiness: Secondary | ICD-10-CM | POA: Diagnosis not present

## 2023-06-18 DIAGNOSIS — S134XXA Sprain of ligaments of cervical spine, initial encounter: Secondary | ICD-10-CM | POA: Diagnosis not present

## 2023-07-18 DIAGNOSIS — E039 Hypothyroidism, unspecified: Secondary | ICD-10-CM | POA: Diagnosis not present

## 2023-07-18 DIAGNOSIS — R7301 Impaired fasting glucose: Secondary | ICD-10-CM | POA: Diagnosis not present

## 2023-07-18 DIAGNOSIS — E559 Vitamin D deficiency, unspecified: Secondary | ICD-10-CM | POA: Diagnosis not present

## 2023-07-18 DIAGNOSIS — E782 Mixed hyperlipidemia: Secondary | ICD-10-CM | POA: Diagnosis not present

## 2023-08-05 DIAGNOSIS — R42 Dizziness and giddiness: Secondary | ICD-10-CM | POA: Diagnosis not present

## 2023-08-05 DIAGNOSIS — S134XXA Sprain of ligaments of cervical spine, initial encounter: Secondary | ICD-10-CM | POA: Diagnosis not present

## 2023-08-05 DIAGNOSIS — S233XXA Sprain of ligaments of thoracic spine, initial encounter: Secondary | ICD-10-CM | POA: Diagnosis not present

## 2023-08-12 DIAGNOSIS — S134XXA Sprain of ligaments of cervical spine, initial encounter: Secondary | ICD-10-CM | POA: Diagnosis not present

## 2023-08-12 DIAGNOSIS — R42 Dizziness and giddiness: Secondary | ICD-10-CM | POA: Diagnosis not present

## 2023-08-12 DIAGNOSIS — S233XXA Sprain of ligaments of thoracic spine, initial encounter: Secondary | ICD-10-CM | POA: Diagnosis not present

## 2023-08-20 DIAGNOSIS — S233XXA Sprain of ligaments of thoracic spine, initial encounter: Secondary | ICD-10-CM | POA: Diagnosis not present

## 2023-08-20 DIAGNOSIS — S134XXA Sprain of ligaments of cervical spine, initial encounter: Secondary | ICD-10-CM | POA: Diagnosis not present

## 2023-08-20 DIAGNOSIS — R42 Dizziness and giddiness: Secondary | ICD-10-CM | POA: Diagnosis not present

## 2023-09-09 DIAGNOSIS — S233XXA Sprain of ligaments of thoracic spine, initial encounter: Secondary | ICD-10-CM | POA: Diagnosis not present

## 2023-09-09 DIAGNOSIS — R42 Dizziness and giddiness: Secondary | ICD-10-CM | POA: Diagnosis not present

## 2023-09-09 DIAGNOSIS — S134XXA Sprain of ligaments of cervical spine, initial encounter: Secondary | ICD-10-CM | POA: Diagnosis not present

## 2023-09-26 ENCOUNTER — Ambulatory Visit: Payer: BC Managed Care – PPO

## 2023-09-26 ENCOUNTER — Other Ambulatory Visit: Payer: Self-pay

## 2023-09-26 ENCOUNTER — Encounter: Payer: Self-pay | Admitting: Emergency Medicine

## 2023-09-26 ENCOUNTER — Ambulatory Visit
Admission: EM | Admit: 2023-09-26 | Discharge: 2023-09-26 | Disposition: A | Discharge status: Home / Self Care | Payer: BC Managed Care – PPO | Attending: Nurse Practitioner | Admitting: Nurse Practitioner

## 2023-09-26 DIAGNOSIS — J040 Acute laryngitis: Secondary | ICD-10-CM | POA: Diagnosis not present

## 2023-09-26 DIAGNOSIS — J029 Acute pharyngitis, unspecified: Secondary | ICD-10-CM | POA: Diagnosis not present

## 2023-09-26 DIAGNOSIS — R059 Cough, unspecified: Secondary | ICD-10-CM

## 2023-09-26 LAB — POCT RAPID STREP A (OFFICE): Rapid Strep A Screen: NEGATIVE

## 2023-09-26 MED ORDER — PSEUDOEPH-BROMPHEN-DM 30-2-10 MG/5ML PO SYRP: 5.0000 mL | ORAL_SOLUTION | Freq: Four times a day (QID) | ORAL | 0 refills | Status: AC | PRN

## 2023-09-26 MED ORDER — LIDOCAINE VISCOUS HCL 2 % MT SOLN: OROMUCOSAL | 0 refills | Status: AC

## 2023-09-26 NOTE — Discharge Instructions (Signed)
The chest x-ray is pending.  You will be contacted if the chest x-ray result is abnormal.  You will also have access to results via MyChart. The rapid strep test was also negative.  A throat culture has been ordered.  You will be contacted if the result of the throat culture is abnormal. Take medication as prescribed. Increase fluids and allow for plenty of rest. May take over-the-counter Tylenol or ibuprofen as needed for pain, fever, or general discomfort. Warm salt water gargles 3-4 times daily as needed for throat pain or discomfort. Chloraseptic throat spray or throat lozenges as needed for throat pain or throat discomfort. Recommend using a humidifier in your bedroom at nighttime during sleep. Voice rest to help with hoarseness. If symptoms do not improve, or you have other concerns, you may follow-up in this clinic or with your primary care physician for further evaluation. Follow-up as needed.

## 2023-09-26 NOTE — ED Triage Notes (Signed)
Pt reports cough x6 weeks, reports worsened on Saturday and reports intermittent chest pressure, loss of voice last few days.

## 2023-09-26 NOTE — ED Provider Notes (Signed)
[facility]    CSN: [id number] Arrival date & time: 09/26/23  1605      History   Chief Complaint Chief Complaint  Patient presents with   Cough    HPI Ashley [name] is a 62 y.o. female.   The history is provided by the patient.   Patient presents for complaints of sore throat, cough, and hoarseness that been present for the past several days.  Patient reports cough is been present for the past 6 weeks, but worsened over the past few days.  Sore throat started over the past several days as well.  Patient denies fever, chills, headache, ear pain, wheezing, difficulty breathing, chest pain, abdominal pain, nausea, vomiting, diarrhea, or rash.  Patient reports she has been taking over-the-counter medications for her symptoms.  Reports that her voice is starting to return as of today.  Past Medical History:  Diagnosis Date   Allergy    Angio-edema    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   Shingles rash 08/05/2015   possible - will start Valtrex today   Tachycardia    no cardiologist    Patient Active Problem List   Diagnosis Date Noted   Osteoporosis, post-menopausal 03/12/2023   Vaginal dryness 01/25/2023   Oral dryness 01/25/2023   Eye dryness 01/25/2023   Contact dermatitis due to chemicals 08/15/2022   Angio-edema 04/22/2022   Periorbital swelling 04/22/2022   Idiopathic urticaria 04/22/2022   Seasonal allergic conjunctivitis 04/22/2022   Chronic rhinitis 04/22/2022   Encounter for screening fecal occult blood testing 04/04/2021   Encounter for well woman  exam with routine gynecological exam 04/04/2021   Screening mammogram for breast cancer 04/04/2021   Muscle cramps 07/17/2020   Screening for colorectal cancer 10/08/2019   Encounter for gynecological examination with Papanicolaou smear of cervix 10/08/2019   Hypertension 08/25/2019   Gastroesophageal reflux disease without esophagitis 08/25/2019   Midline low back pain 08/25/2019   Tachycardia 08/25/2019   Special screening for malignant neoplasms, colon    Hypothyroidism 06/16/2015   Vaginal irritation 11/04/2014   Hematuria 11/04/2014   Hot flashes 11/04/2014   Hormone replacement therapy (HRT) 11/04/2014   PMB (postmenopausal bleeding) 02/15/2014   RUPTURE ROTATOR CUFF 03/09/2009   Pain in joint, shoulder region 06/23/2008   IMPINGEMENT SYNDROME 06/23/2008    Past Surgical History:  Procedure Laterality Date   APPENDECTOMY  1966   CHOLECYSTECTOMY  04/18/2007   CHONDROPLASTY Right 08/11/2015   Procedure: CHONDROPLASTY;  Surgeon: Loreta Ave, MD;  Location: [facility];  Service: Orthopedics;  Laterality: Right;   COLONOSCOPY N/A 07/15/2015   Procedure: COLONOSCOPY;  Surgeon: West Bali, MD;  Location: [facility];  Service: Endoscopy;  Laterality: N/A;  10:45 AM   ENDOMETRIAL ABLATION  03/14/2011   HYSTEROSCOPY WITH D & C  03/14/2011   KNEE ARTHROSCOPY WITH EXCISION PLICA Right 08/11/2015   Procedure: KNEE ARTHROSCOPY WITH EXCISION PLICA;  Surgeon: Loreta Ave, MD;  Location: [facility];  Service: Orthopedics;  Laterality:  Right;   SHOULDER ARTHROSCOPY Right    SPINE SURGERY      OB History     Gravida  7   Para  6   Term      Preterm      AB  1   Living  6      SAB  1   IAB      Ectopic      Multiple      Live Births               Home Medications    Prior to Admission medications   Medication Sig Start Date End Date Taking? Authorizing Provider  brompheniramine-pseudoephedrine-DM 30-2-10 MG/5ML syrup  Take 5 mLs by mouth 4 (four) times daily as needed. 09/26/23  Yes Leath-Warren, Sadie Haber, NP  lidocaine (XYLOCAINE) 2 % solution Gargle and spit 5 mL every 6 hours as needed for throat pain or discomfort. 09/26/23  Yes Leath-Warren, Sadie Haber, NP  pravastatin (PRAVACHOL) 20 MG tablet Take 20 mg by mouth daily. 08/05/23  Yes [provider]  atenolol (TENORMIN) 50 MG tablet Take 1 tablet (50 mg total) by mouth daily. 10/09/19   Corum, Minerva Fester, MD  calcium carbonate (OS-CAL) 600 MG TABS tablet Take 600 mg by mouth daily.     [provider]  CEQUA 0.09 % SOLN Apply 1 drop to eye 2 (two) times daily. 07/06/22   [provider]  Conj Estrogens-Bazedoxifene (DUAVEE) 0.45-20 MG TABS Take 1 tablet by mouth daily. 02/21/23   Adline Potter, NP  EPINEPHrine 0.3 mg/0.3 mL IJ SOAJ injection Inject 0.3 mg into the muscle as needed for anaphylaxis. As needed for life-threatening allergic reactions 05/07/22   Alfonse Spruce, MD  esomeprazole (NEXIUM) 40 MG capsule Take 1 capsule (40 mg total) by mouth daily. 11/16/19   Corum, Minerva Fester, MD  ketotifen (ZADITOR) 0.025 % ophthalmic solution 1 drop 2 (two) times daily.    [provider]  levothyroxine (SYNTHROID) 137 MCG tablet Take 1 tablet (137 mcg total) by mouth daily before breakfast. 07/19/20   Romero Belling, MD  Loteprednol Etabonate (EYSUVIS) 0.25 % SUSP Apply to eye.    [provider]  MELATONIN PO Take by mouth.    [provider]  MIEBO 1.338 GM/ML SOLN Apply to eye. 08/08/22   [provider]  Multiple Vitamin (MULTIVITAMIN) tablet Take 1 tablet by mouth daily.    [provider]  NAPROXEN PO Take by mouth in the morning and at bedtime.    [provider]  triamcinolone cream (KENALOG) 0.1 % Apply 1 Application topically 2 (two) times daily. To the affected areas twice daily as needed until symptoms improve. 02/16/23   Leath-Warren, Sadie Haber, NP  zolpidem (AMBIEN) 10 MG  tablet Take 1 tablet (10 mg total) by mouth daily. 01/25/23   Adline Potter, NP    Family History Family History  Problem Relation Age of Onset   Hypertension Mother    Heart disease Mother        pacemaker   Dementia Mother    Hypertension Father    Parkinson's disease Father    Thyroid disease Father    Cancer Sister    Mental illness Sister    Bipolar disorder Sister    Cancer Maternal Grandmother        breast,eye   Heart disease Maternal Grandmother    Heart attack Maternal Grandfather    Alcohol abuse Paternal Grandfather  Anesthesia problems Daughter        post-op N/V   Other Son        Lyme disease    Social History Social History   Tobacco Use   Smoking status: Never   Smokeless tobacco: Never  Vaping Use   Vaping status: Never Used  Substance Use Topics   Alcohol use: No   Drug use: No     Allergies   Sulfa antibiotics, Nitrofurantoin, Other, and Macrobid [nitrofurantoin monohyd macro]   Review of Systems Review of Systems Per HPI  Physical Exam Triage Vital Signs ED Triage Vitals  Encounter Vitals Group     BP 09/26/23 1803 123/68     Systolic BP Percentile --      Diastolic BP Percentile --      Pulse Rate 09/26/23 1803 68     Resp 09/26/23 1803 17     Temp 09/26/23 1803 98.8 F (37.1 C)     Temp Source 09/26/23 1803 Oral     SpO2 09/26/23 1803 97 %     Weight --      Height --      Head Circumference --      Peak Flow --      Pain Score 09/26/23 1826 2     Pain Loc --      Pain Education --      Exclude from Growth Chart --    No data found.  Updated Vital Signs BP 123/68 (BP Location: Right Arm)   Pulse 68   Temp 98.8 F (37.1 C) (Oral)   Resp 17   SpO2 97%   Visual Acuity Right Eye Distance:   Left Eye Distance:   Bilateral Distance:    Right Eye Near:   Left Eye Near:    Bilateral Near:     Physical Exam Vitals and nursing note reviewed.  Constitutional:      General: She is not in acute distress.     Appearance: Normal appearance.  HENT:     Head: Normocephalic.     Right Ear: Tympanic membrane, ear canal and external ear normal.     Left Ear: Tympanic membrane, ear canal and external ear normal.     Nose: Nose normal.     Mouth/Throat:     Mouth: Mucous membranes are moist.     Pharynx: Posterior oropharyngeal erythema present.     Comments: Cobblestoning present to posterior oropharynx secondary to Eyes:     Extraocular Movements: Extraocular movements intact.     Pupils: Pupils are equal, round, and reactive to light.  Cardiovascular:     Rate and Rhythm: Normal rate and regular rhythm.     Pulses: Normal pulses.     Heart sounds: Normal heart sounds.  Pulmonary:     Effort: Pulmonary effort is normal. No respiratory distress.     Breath sounds: Normal breath sounds. No stridor. No wheezing, rhonchi or rales.  Abdominal:     General: Bowel sounds are normal.     Palpations: Abdomen is soft.     Tenderness: There is no abdominal tenderness.  Musculoskeletal:     Cervical back: Normal range of motion.  Lymphadenopathy:     Cervical: No cervical adenopathy.  Skin:    General: Skin is warm and dry.  Neurological:     General: No focal deficit present.     Mental Status: She is alert and oriented to person, place, and time.  Psychiatric:  Mood and Affect: Mood normal.        Behavior: Behavior normal.      UC Treatments / Results  Labs (all labs ordered are listed, but only abnormal results are displayed) Labs Reviewed  CULTURE, GROUP A STREP [facility])  POCT RAPID STREP A (OFFICE)    EKG   Radiology No results found.  Procedures Procedures (including critical care time)  Medications Ordered in UC Medications - No data to display  Initial Impression / Assessment and Plan / UC Course  I have reviewed the triage vital signs and the nursing notes.  Pertinent labs & imaging results that were available during my care of the patient were reviewed by me and  considered in my medical decision making (see chart for details).  The rapid strep test was negative, throat culture is pending.  Chest x-ray is also pending.  On exam, lung sounds were clear throughout, room air sats at 97%.  On exam, patient with postnasal drainage seen on exam, which may also be contributing to her cough.  Will treat patient's cough with Bromfed-DM, and for throat pain, viscous lidocaine 2% provided for patient to gargle and spit for throat pain or discomfort.  Supportive care recommendations were provided and discussed with the patient to include fluids, rest, warm salt water gargles, and over-the-counter analgesics.  Discussed indications with the patient regarding when follow-up be necessary.  Patient was in agreement with this plan of care and verbalized understanding.  All questions were answered.  Patient stable for discharge. Final Clinical Impressions(s) / UC Diagnoses   Final diagnoses:  Cough, unspecified type  Sore throat  Laryngitis     Discharge Instructions      The chest x-ray is pending.  You will be contacted if the chest x-ray result is abnormal.  You will also have access to results via MyChart. The rapid strep test was also negative.  A throat culture has been ordered.  You will be contacted if the result of the throat culture is abnormal. Take medication as prescribed. Increase fluids and allow for plenty of rest. May take over-the-counter Tylenol or ibuprofen as needed for pain, fever, or general discomfort. Warm salt water gargles 3-4 times daily as needed for throat pain or discomfort. Chloraseptic throat spray or throat lozenges as needed for throat pain or throat discomfort. Recommend using a humidifier in your bedroom at nighttime during sleep. Voice rest to help with hoarseness. If symptoms do not improve, or you have other concerns, you may follow-up in this clinic or with your primary care physician for further evaluation. Follow-up as  needed.     ED Prescriptions     Medication Sig Dispense Auth. Provider   lidocaine (XYLOCAINE) 2 % solution Gargle and spit 5 mL every 6 hours as needed for throat pain or discomfort. 100 mL Leath-Warren, Sadie Haber, NP   brompheniramine-pseudoephedrine-DM 30-2-10 MG/5ML syrup Take 5 mLs by mouth 4 (four) times daily as needed. 140 mL Leath-Warren, Sadie Haber, NP      PDMP not reviewed this encounter.   Abran Cantor, NP 09/26/23 1904

## 2023-09-30 LAB — CULTURE, GROUP A STREP (THRC)

## 2023-10-31 DIAGNOSIS — R42 Dizziness and giddiness: Secondary | ICD-10-CM | POA: Diagnosis not present

## 2023-10-31 DIAGNOSIS — S134XXA Sprain of ligaments of cervical spine, initial encounter: Secondary | ICD-10-CM | POA: Diagnosis not present

## 2023-10-31 DIAGNOSIS — S233XXA Sprain of ligaments of thoracic spine, initial encounter: Secondary | ICD-10-CM | POA: Diagnosis not present

## 2023-11-08 DIAGNOSIS — S233XXA Sprain of ligaments of thoracic spine, initial encounter: Secondary | ICD-10-CM | POA: Diagnosis not present

## 2023-11-08 DIAGNOSIS — S134XXA Sprain of ligaments of cervical spine, initial encounter: Secondary | ICD-10-CM | POA: Diagnosis not present

## 2023-11-08 DIAGNOSIS — R42 Dizziness and giddiness: Secondary | ICD-10-CM | POA: Diagnosis not present

## 2023-11-19 DIAGNOSIS — S233XXA Sprain of ligaments of thoracic spine, initial encounter: Secondary | ICD-10-CM | POA: Diagnosis not present

## 2023-11-19 DIAGNOSIS — S134XXA Sprain of ligaments of cervical spine, initial encounter: Secondary | ICD-10-CM | POA: Diagnosis not present

## 2023-11-19 DIAGNOSIS — R42 Dizziness and giddiness: Secondary | ICD-10-CM | POA: Diagnosis not present

## 2023-11-26 DIAGNOSIS — R42 Dizziness and giddiness: Secondary | ICD-10-CM | POA: Diagnosis not present

## 2023-11-26 DIAGNOSIS — S233XXA Sprain of ligaments of thoracic spine, initial encounter: Secondary | ICD-10-CM | POA: Diagnosis not present

## 2023-11-26 DIAGNOSIS — S134XXA Sprain of ligaments of cervical spine, initial encounter: Secondary | ICD-10-CM | POA: Diagnosis not present

## 2023-12-02 DIAGNOSIS — H0288A Meibomian gland dysfunction right eye, upper and lower eyelids: Secondary | ICD-10-CM | POA: Diagnosis not present

## 2023-12-02 DIAGNOSIS — L718 Other rosacea: Secondary | ICD-10-CM | POA: Diagnosis not present

## 2023-12-02 DIAGNOSIS — H0288B Meibomian gland dysfunction left eye, upper and lower eyelids: Secondary | ICD-10-CM | POA: Diagnosis not present

## 2023-12-02 DIAGNOSIS — H16223 Keratoconjunctivitis sicca, not specified as Sjogren's, bilateral: Secondary | ICD-10-CM | POA: Diagnosis not present

## 2024-01-15 DIAGNOSIS — R7301 Impaired fasting glucose: Secondary | ICD-10-CM | POA: Diagnosis not present

## 2024-01-15 DIAGNOSIS — E782 Mixed hyperlipidemia: Secondary | ICD-10-CM | POA: Diagnosis not present

## 2024-01-15 DIAGNOSIS — E559 Vitamin D deficiency, unspecified: Secondary | ICD-10-CM | POA: Diagnosis not present

## 2024-01-15 DIAGNOSIS — E039 Hypothyroidism, unspecified: Secondary | ICD-10-CM | POA: Diagnosis not present

## 2024-01-28 DIAGNOSIS — H0288B Meibomian gland dysfunction left eye, upper and lower eyelids: Secondary | ICD-10-CM | POA: Diagnosis not present

## 2024-01-28 DIAGNOSIS — H16223 Keratoconjunctivitis sicca, not specified as Sjogren's, bilateral: Secondary | ICD-10-CM | POA: Diagnosis not present

## 2024-01-28 DIAGNOSIS — H524 Presbyopia: Secondary | ICD-10-CM | POA: Diagnosis not present

## 2024-01-28 DIAGNOSIS — H0288A Meibomian gland dysfunction right eye, upper and lower eyelids: Secondary | ICD-10-CM | POA: Diagnosis not present

## 2024-01-28 DIAGNOSIS — L718 Other rosacea: Secondary | ICD-10-CM | POA: Diagnosis not present

## 2024-02-17 ENCOUNTER — Other Ambulatory Visit: Payer: Self-pay | Admitting: Adult Health

## 2024-03-06 DIAGNOSIS — S63003A Unspecified subluxation of unspecified wrist and hand, initial encounter: Secondary | ICD-10-CM | POA: Diagnosis not present

## 2024-03-06 DIAGNOSIS — M65351 Trigger finger, right little finger: Secondary | ICD-10-CM | POA: Diagnosis not present

## 2024-03-17 DIAGNOSIS — H0288A Meibomian gland dysfunction right eye, upper and lower eyelids: Secondary | ICD-10-CM | POA: Diagnosis not present

## 2024-03-17 DIAGNOSIS — L718 Other rosacea: Secondary | ICD-10-CM | POA: Diagnosis not present

## 2024-03-17 DIAGNOSIS — H0288B Meibomian gland dysfunction left eye, upper and lower eyelids: Secondary | ICD-10-CM | POA: Diagnosis not present

## 2024-03-17 DIAGNOSIS — H16223 Keratoconjunctivitis sicca, not specified as Sjogren's, bilateral: Secondary | ICD-10-CM | POA: Diagnosis not present

## 2024-04-03 DIAGNOSIS — R062 Wheezing: Secondary | ICD-10-CM | POA: Diagnosis not present

## 2024-04-03 DIAGNOSIS — R0989 Other specified symptoms and signs involving the circulatory and respiratory systems: Secondary | ICD-10-CM | POA: Diagnosis not present

## 2024-04-03 DIAGNOSIS — R059 Cough, unspecified: Secondary | ICD-10-CM | POA: Diagnosis not present

## 2024-04-28 ENCOUNTER — Ambulatory Visit: Admitting: Adult Health

## 2024-06-30 ENCOUNTER — Ambulatory Visit: Admitting: Adult Health

## 2024-06-30 ENCOUNTER — Encounter: Payer: Self-pay | Admitting: Adult Health

## 2024-06-30 VITALS — BP 146/72 | HR 60 | Ht 71.0 in | Wt 193.0 lb

## 2024-06-30 DIAGNOSIS — Z1211 Encounter for screening for malignant neoplasm of colon: Secondary | ICD-10-CM

## 2024-06-30 DIAGNOSIS — Z1331 Encounter for screening for depression: Secondary | ICD-10-CM | POA: Diagnosis not present

## 2024-06-30 DIAGNOSIS — K649 Unspecified hemorrhoids: Secondary | ICD-10-CM

## 2024-06-30 DIAGNOSIS — Z1231 Encounter for screening mammogram for malignant neoplasm of breast: Secondary | ICD-10-CM

## 2024-06-30 DIAGNOSIS — Z01419 Encounter for gynecological examination (general) (routine) without abnormal findings: Secondary | ICD-10-CM | POA: Diagnosis not present

## 2024-06-30 DIAGNOSIS — R030 Elevated blood-pressure reading, without diagnosis of hypertension: Secondary | ICD-10-CM | POA: Diagnosis not present

## 2024-06-30 DIAGNOSIS — Z79890 Hormone replacement therapy (postmenopausal): Secondary | ICD-10-CM

## 2024-06-30 LAB — HEMOCCULT GUIAC POC 1CARD (OFFICE): Fecal Occult Blood, POC: NEGATIVE

## 2024-06-30 MED ORDER — DUAVEE 0.45-20 MG PO TABS: 1.0000 | ORAL_TABLET | Freq: Every day | ORAL | 3 refills | Status: AC

## 2024-06-30 NOTE — Progress Notes (Signed)
 Patient ID: Ashley [name], female   DOB: 1960-10-21, 63 y.o.   MRN: [id number] History of Present Illness: Ashley [name] is a 63 year old white female, married, PM in for a well woman gyn exam.     Component Value Date/Time   DIAGPAP  01/25/2023 0842    - Negative for intraepithelial lesion or malignancy (NILM)   DIAGPAP  10/08/2019 0947    - Negative for intraepithelial lesion or malignancy (NILM)   HPVHIGH Negative 01/25/2023 0842   HPVHIGH Negative 10/08/2019 0947   ADEQPAP  01/25/2023 0842    Satisfactory for evaluation; transformation zone component ABSENT.   ADEQPAP  10/08/2019 0947    Satisfactory for evaluation. The presence or absence of an   ADEQPAP  10/08/2019 0947    endocervical/transformation zone component cannot be determined because   ADEQPAP of atrophy. 10/08/2019 0947    PCP is Dr Shona  Current Medications, Allergies, Past Medical History, Past Surgical History, Family History and Social History were reviewed in [employer] electronic medical record.     Review of Systems: Patient denies any headaches, hearing loss, fatigue, blurred vision, shortness of breath, chest pain, abdominal pain, problems with bowel movements, urination, or intercourse(not active). No joint pain or mood swings.  Not really dizzy but off some times Denies any vaginal bleeding Has some rectal bleeding that resolved Has stress, adopted daughter has mental issues   Physical Exam:BP (!) 146/72 (BP Location: Right Arm, Patient Position: Sitting, Cuff Size: Normal)   Pulse 60   Ht 5' 11 (1.803 m)   Wt 193 lb (87.5 kg)   BMI 26.92 kg/m   General:  Well developed, well nourished, no acute distress Skin:  Warm and dry Neck:  Midline trachea, normal thyroid , good ROM, no lymphadenopathy, no carotid bruits heard Lungs; Clear to auscultation bilaterally Breast:  No dominant palpable mass, retraction, or nipple discharge Cardiovascular: Regular rate and rhythm Abdomen:  Soft, non tender, no  hepatosplenomegaly Pelvic:  External genitalia is normal in appearance, no lesions.  The vagina is normal in appearance. Urethra has no lesions or masses. The cervix is smooth.  Uterus is felt to be normal size, shape, and contour.  No adnexal masses or tenderness noted.Bladder is non tender, no masses felt. Rectal: Good sphincter tone, no polyps, + hemorrhoids felt.  Hemoccult negative. Extremities/musculoskeletal:  No swelling or varicosities noted, no clubbing or cyanosis Psych:  No mood changes, alert and cooperative,seems happy AA is 0 Fall risk is moderate    06/30/2024   10:05 AM 01/25/2023    8:32 AM 04/04/2021   10:03 AM  Depression screen PHQ 2/9  Decreased Interest 0 0 0  Down, Depressed, Hopeless 0 0 1  PHQ - 2 Score 0 0 1  Altered sleeping 3 1 3   Tired, decreased energy 0 0 0  Change in appetite 0 0 0  Feeling bad or failure about yourself  0 0 0  Trouble concentrating 0 0 0  Moving slowly or fidgety/restless 0 0 0  Suicidal thoughts 0 0 0  PHQ-9 Score 3 1 4        06/30/2024   10:06 AM 01/25/2023    8:32 AM 04/04/2021   10:03 AM  GAD 7 : Generalized Anxiety Score  Nervous, Anxious, on Edge 0 0 0  Control/stop worrying 0 0 0  Worry too much - different things 0 0 0  Trouble relaxing 0 0 0  Restless 0 0 0  Easily annoyed or irritable 0 0 0  Afraid - awful might happen 0 0 0  Total GAD 7 Score 0 0 0   Examination chaperoned by Tish RN   Impression and Plan: 1. Encounter for well woman exam with routine gynecological exam (Primary) Pap in 2027 Physical in 1 year Labs with PCP Colonoscopy in 2026 Stay active, she walks about 2 miles every day   2. Encounter for screening fecal occult blood testing Hemoccult was negative  - POCT occult blood stool  3. Hemorrhoids, unspecified hemorrhoid type  4. Hormone replacement therapy (HRT) Happy with Duavee , will refill Meds ordered this encounter  Medications   Conj Estrogens -Bazedoxifene (DUAVEE ) 0.45-20 MG TABS     Sig: Take 1 tablet by mouth daily.    Dispense:  90 tablet    Refill:  3    Supervising Provider:   JAYNE, LUTHER H [[id number]]     5. Screening mammogram for breast cancer Pt to call for appt - MM 3D SCREENING MAMMOGRAM BILATERAL BREAST; Future  6. Elevated BP without diagnosis of hypertension Keep check on BP and follow up with PCP

## 2024-07-22 ENCOUNTER — Ambulatory Visit (HOSPITAL_COMMUNITY)

## 2024-07-22 DIAGNOSIS — E782 Mixed hyperlipidemia: Secondary | ICD-10-CM | POA: Diagnosis not present

## 2024-07-22 DIAGNOSIS — E039 Hypothyroidism, unspecified: Secondary | ICD-10-CM | POA: Diagnosis not present

## 2024-07-22 DIAGNOSIS — R7301 Impaired fasting glucose: Secondary | ICD-10-CM | POA: Diagnosis not present

## 2024-07-22 DIAGNOSIS — E559 Vitamin D deficiency, unspecified: Secondary | ICD-10-CM | POA: Diagnosis not present

## 2024-09-16 DIAGNOSIS — H16223 Keratoconjunctivitis sicca, not specified as Sjogren's, bilateral: Secondary | ICD-10-CM | POA: Diagnosis not present

## 2024-09-16 DIAGNOSIS — L718 Other rosacea: Secondary | ICD-10-CM | POA: Diagnosis not present

## 2024-09-16 DIAGNOSIS — H0288A Meibomian gland dysfunction right eye, upper and lower eyelids: Secondary | ICD-10-CM | POA: Diagnosis not present

## 2024-09-16 DIAGNOSIS — H0288B Meibomian gland dysfunction left eye, upper and lower eyelids: Secondary | ICD-10-CM | POA: Diagnosis not present
